# Patient Record
Sex: Female | Born: 1960 | Marital: Married | Smoking: Former smoker
Health system: Southern US, Community
[De-identification: ages and names within clinical notes are randomized; demographics above are authoritative.]

## PROBLEM LIST (undated history)

## (undated) DIAGNOSIS — F419 Anxiety disorder, unspecified: Secondary | ICD-10-CM

## (undated) HISTORY — DX: Anxiety disorder, unspecified: F41.9

## (undated) HISTORY — PX: TONSILLECTOMY: SUR1361

---

## 2014-12-20 DIAGNOSIS — K219 Gastro-esophageal reflux disease without esophagitis: Secondary | ICD-10-CM | POA: Insufficient documentation

## 2014-12-21 ENCOUNTER — Other Ambulatory Visit: Payer: Self-pay | Admitting: Family Medicine

## 2014-12-21 DIAGNOSIS — Z1231 Encounter for screening mammogram for malignant neoplasm of breast: Secondary | ICD-10-CM

## 2014-12-28 ENCOUNTER — Ambulatory Visit
Admission: RE | Admit: 2014-12-28 | Discharge: 2014-12-28 | Disposition: A | Discharge status: Home / Self Care | Payer: No Typology Code available for payment source | Source: Ambulatory Visit | Attending: Family Medicine | Admitting: Family Medicine

## 2014-12-28 DIAGNOSIS — Z1231 Encounter for screening mammogram for malignant neoplasm of breast: Secondary | ICD-10-CM | POA: Insufficient documentation

## 2015-01-17 ENCOUNTER — Other Ambulatory Visit: Payer: Self-pay | Admitting: Family Medicine

## 2015-01-17 DIAGNOSIS — R928 Other abnormal and inconclusive findings on diagnostic imaging of breast: Secondary | ICD-10-CM

## 2015-01-27 ENCOUNTER — Ambulatory Visit
Admission: RE | Admit: 2015-01-27 | Discharge: 2015-01-27 | Disposition: A | Discharge status: Home / Self Care | Payer: BLUE CROSS/BLUE SHIELD | Source: Ambulatory Visit | Attending: Family Medicine | Admitting: Family Medicine

## 2015-01-27 DIAGNOSIS — N63 Unspecified lump in breast: Secondary | ICD-10-CM | POA: Insufficient documentation

## 2015-01-27 DIAGNOSIS — R928 Other abnormal and inconclusive findings on diagnostic imaging of breast: Secondary | ICD-10-CM

## 2015-06-06 ENCOUNTER — Other Ambulatory Visit: Payer: Self-pay | Admitting: Family Medicine

## 2015-06-06 DIAGNOSIS — R928 Other abnormal and inconclusive findings on diagnostic imaging of breast: Secondary | ICD-10-CM

## 2015-07-14 ENCOUNTER — Ambulatory Visit
Admission: RE | Admit: 2015-07-14 | Discharge: 2015-07-14 | Disposition: A | Discharge status: Home / Self Care | Payer: 59 | Source: Ambulatory Visit | Attending: Family Medicine | Admitting: Family Medicine

## 2015-07-14 DIAGNOSIS — R928 Other abnormal and inconclusive findings on diagnostic imaging of breast: Secondary | ICD-10-CM

## 2015-08-22 ENCOUNTER — Emergency Department
Admission: EM | Admit: 2015-08-22 | Discharge: 2015-08-23 | Disposition: A | Discharge status: Home / Self Care | Payer: 59 | Attending: Student | Admitting: Student

## 2015-08-22 ENCOUNTER — Emergency Department: Payer: 59

## 2015-08-22 ENCOUNTER — Encounter: Payer: Self-pay | Admitting: Emergency Medicine

## 2015-08-22 DIAGNOSIS — M25551 Pain in right hip: Secondary | ICD-10-CM | POA: Diagnosis present

## 2015-08-22 DIAGNOSIS — S76021A Laceration of muscle, fascia and tendon of right hip, initial encounter: Secondary | ICD-10-CM | POA: Insufficient documentation

## 2015-08-22 DIAGNOSIS — Y929 Unspecified place or not applicable: Secondary | ICD-10-CM | POA: Diagnosis not present

## 2015-08-22 DIAGNOSIS — T148XXA Other injury of unspecified body region, initial encounter: Secondary | ICD-10-CM

## 2015-08-22 DIAGNOSIS — Y999 Unspecified external cause status: Secondary | ICD-10-CM | POA: Diagnosis not present

## 2015-08-22 DIAGNOSIS — Y939 Activity, unspecified: Secondary | ICD-10-CM | POA: Diagnosis not present

## 2015-08-22 DIAGNOSIS — W1839XA Other fall on same level, initial encounter: Secondary | ICD-10-CM | POA: Insufficient documentation

## 2015-08-22 DIAGNOSIS — Z87891 Personal history of nicotine dependence: Secondary | ICD-10-CM | POA: Insufficient documentation

## 2015-08-22 DIAGNOSIS — R58 Hemorrhage, not elsewhere classified: Secondary | ICD-10-CM | POA: Diagnosis not present

## 2015-08-22 DIAGNOSIS — W19XXXA Unspecified fall, initial encounter: Secondary | ICD-10-CM

## 2015-08-22 LAB — BASIC METABOLIC PANEL
Anion gap: 8 (ref 5–15)
BUN: 13 mg/dL (ref 6–20)
CO2: 27 mmol/L (ref 22–32)
CREATININE: 0.6 mg/dL (ref 0.44–1.00)
Calcium: 9.5 mg/dL (ref 8.9–10.3)
Chloride: 105 mmol/L (ref 101–111)
GFR calc Af Amer: >60 mL/min (ref >60)
GLUCOSE: 94 mg/dL (ref 65–99)
Potassium: 3.9 mmol/L (ref 3.5–5.1)
SODIUM: 140 mmol/L (ref 135–145)

## 2015-08-22 LAB — CBC WITH DIFFERENTIAL/PLATELET
Basophils Absolute: 0.1 10*3/uL (ref 0–0.1)
Basophils Relative: 1 %
EOS ABS: 0.2 10*3/uL (ref 0–0.7)
EOS PCT: 3 %
HCT: 36.3 % (ref 35.0–47.0)
Hemoglobin: 12.9 g/dL (ref 12.0–16.0)
LYMPHS ABS: 2.5 10*3/uL (ref 1.0–3.6)
Lymphocytes Relative: 30 %
MCH: 31.9 pg (ref 26.0–34.0)
MCHC: 35.7 g/dL (ref 32.0–36.0)
MCV: 89.5 fL (ref 80.0–100.0)
MONO ABS: 0.7 10*3/uL (ref 0.2–0.9)
MONOS PCT: 9 %
Neutro Abs: 4.9 10*3/uL (ref 1.4–6.5)
Neutrophils Relative %: 57 %
PLATELETS: 250 10*3/uL (ref 150–440)
RBC: 4.06 MIL/uL (ref 3.80–5.20)
RDW: 13.1 % (ref 11.5–14.5)
WBC: 8.4 10*3/uL (ref 3.6–11.0)

## 2015-08-22 LAB — POCT PREGNANCY, URINE: Preg Test, Ur: NEGATIVE

## 2015-08-22 MED ORDER — IOPAMIDOL (ISOVUE-300) INJECTION 61%
100.0000 mL | Freq: Once | INTRAVENOUS | Status: AC | PRN
  Administered 2015-08-22: 100 mL via INTRAVENOUS
  Filled 2015-08-22: qty 100

## 2015-08-22 MED ORDER — DIATRIZOATE MEGLUMINE & SODIUM 66-10 % PO SOLN
15.0000 mL | Freq: Once | ORAL | Status: AC
  Administered 2015-08-22: 15 mL via ORAL

## 2015-08-22 NOTE — ED Notes (Signed)
NP at bedside.

## 2015-08-22 NOTE — ED Triage Notes (Signed)
Patient ambulatory to triage with steady gait, without difficulty or distress noted; pt reports falling off porch last Wed and hitting right hip/groin on concrete barrier; c/o persistent pain with bruising; ibuprofen 1tab taken at 1230pm with relief; st pain increases with weight bearing

## 2015-08-22 NOTE — ED Provider Notes (Signed)
[facility] Emergency Department Provider Note ____________________________________________  Time seen: Approximately 8:20 PM  I have reviewed the triage vital signs and the nursing notes.   HISTORY  Chief Complaint Hip Pain    HPI [name] is a 55 y.o. female presents to the emergency department for evaluation of right hip and abdominal pain. She states that  approximately 1 week ago, she fellfrom her porch and hit her right hip and groin on a concrete landscape barrier. She has had persistent pain in the hip and right lower abdomen since that time. She has been taking ibuprofen "like candy." She states that the ibuprofen does provide some relief. She states that her hip hurts with certain motions, such as getting into the car and going from a seated to a standing position. She states that her abdomen has been very bloated since this occurred and has a sensation "like I drank a bunch of water." She denies dizziness, nausea, vomiting or fever. She denies hematuria or dark tarry stools.  History reviewed. No pertinent past medical history.  There are no active problems to display for this patient.   Past Surgical History:  Procedure Laterality Date  . TONSILLECTOMY      Prior to Admission medications   Not on File    Allergies Review of patient's allergies indicates no known allergies.  Family History  Problem Relation Age of Onset  . Breast cancer Neg Hx     Social History Social History  Substance Use Topics  . Smoking status: Former Games developer  . Smokeless tobacco: Never Used  . Alcohol use No    Review of Systems Constitutional: No recent illness. Cardiovascular: Denies chest pain or palpitations. Respiratory: Denies shortness of breath. Musculoskeletal: Pain in Right hip Abdominal:  Positive for right-sided abdominal pain Skin: Negative for rash, wound, lesion. Neurological: Negative for focal weakness or  numbness.  ____________________________________________   PHYSICAL EXAM:  VITAL SIGNS: ED Triage Vitals [08/22/15 1929]  Enc Vitals Group     BP (!) 148/82     Pulse Rate 71     Resp 18     Temp 97.6 F (36.4 C)     Temp Source Oral     SpO2 100 %     Weight 140 lb (63.5 kg)     Height 5\' 4"  (1.626 m)     Head Circumference      Peak Flow      Pain Score 4     Pain Loc      Pain Edu?      Excl. in GC?     Constitutional: Alert and oriented. Well appearing and in no acute distress. Eyes: Conjunctivae are normal. EOMI. Head: Atraumatic. Neck: No stridor.  Respiratory: Normal respiratory effort.   Musculoskeletal: No increase in pain of the right hip with internal or external rotation. Tenderness elicited with palpation over the anterior, superior iliac spine. No tenderness to palpation over the entire length of the right lower extremity. Neurologic:  Normal speech and language. No gross focal neurologic deficits are appreciated. Speech is normal. No gait instability. Skin:  Skin is warm and dry. Scabbed abrasion noted to the right hip with widespread ecchymosis over the right lower quadrant of the abdomen that extends distally over the hip and right lower extremity above the knee. Psychiatric: Mood and affect are normal. Speech and behavior are normal.  ____________________________________________   LABS (all labs ordered are listed, but only abnormal results are displayed)  Labs Reviewed  CBC WITH DIFFERENTIAL/PLATELET  BASIC METABOLIC PANEL  POCT PREGNANCY, URINE   ____________________________________________  RADIOLOGY  Right hip film negative for acute bony abnormality.  CT abdomen and pelvis findings per radiology. Focal tear of the right transverse abdominis and external oblique musculature extending from level superior to the iliac crest inferiorly. No significant hematoma or fluid collection. No other acute/ traumatic intra-abdominal or pelvic pathology  identified. There are innumerable hepatic hypodense lesions measuring up to 5.3 cm in the right lobe of the liver. These lesions are incompletely characterized but most likely represent cysts or hemangiomas. MRI may provide better characterisation. ____________________________________________   PROCEDURES  Procedure(s) performed: None   ____________________________________________   INITIAL IMPRESSION / ASSESSMENT AND PLAN / ED COURSE  Pertinent labs & imaging results that were available during my care of the patient were reviewed by me and considered in my medical decision making (see chart for details).  Lifting restrictions were discussed with the patient. She was advised to follow-up with her primary care provider for recheck of the abdominal wall as well as further evaluation of the cystic structures identified on the CT scan. She was advised to avoid Tylenol and Tylenol containing medications until cleared by the primary care provider. She was instructed to take Aleve twice per day as needed. She was instructed to return to the emergency department for symptoms that change or worsen if she is unable to schedule an appointment. ____________________________________________   FINAL CLINICAL IMPRESSION(S) / ED DIAGNOSES  Final diagnoses:  Muscle tear  Ecchymosis  Hip pain, right       Chinita Pester, FNP 08/23/15 0017    Gayla Doss, MD 08/23/15 [id number]

## 2015-08-22 NOTE — ED Notes (Signed)
See triage note..the patient does having bruising to R hip consistent w/ injury.  Pt sts that spouse noticed increased bruising going down R thigh.  Pt also sts that she "feels full" all the time and has "bloated belly".  Pt requested delaying XR until after speaking w/ caregiver.

## 2015-08-23 NOTE — ED Notes (Signed)
Discharge instructions reviewed with patient. Questions fielded by this RN. Patient verbalizes understanding of instructions. Patient discharged home in stable condition per Triplett NP. No acute distress noted at time of discharge.   

## 2015-08-23 NOTE — Discharge Instructions (Signed)
Schedule an appointment with your primary care provider for further evaluation of the cystic structures on your liver. Also, follow up for recheck of the abdominal wall muscle tear.  Return to the ER for symptoms that change or worsen or for new concerns.

## 2015-08-23 NOTE — ED Notes (Signed)
NP at bedside.

## 2015-09-14 ENCOUNTER — Other Ambulatory Visit: Payer: Self-pay | Admitting: Gastroenterology

## 2015-09-14 DIAGNOSIS — K769 Liver disease, unspecified: Secondary | ICD-10-CM

## 2015-09-27 ENCOUNTER — Ambulatory Visit
Admission: RE | Admit: 2015-09-27 | Discharge: 2015-09-27 | Disposition: A | Discharge status: Home / Self Care | Payer: 59 | Source: Ambulatory Visit | Attending: Gastroenterology | Admitting: Gastroenterology

## 2015-09-27 DIAGNOSIS — K769 Liver disease, unspecified: Secondary | ICD-10-CM

## 2015-09-27 MED ORDER — GADOBENATE DIMEGLUMINE 529 MG/ML IV SOLN
13.0000 mL | Freq: Once | INTRAVENOUS | Status: AC | PRN
  Administered 2015-09-27: 13 mL via INTRAVENOUS

## 2015-12-19 ENCOUNTER — Ambulatory Visit (HOSPITAL_COMMUNITY): Payer: 59 | Admitting: Licensed Clinical Social Worker

## 2015-12-20 DIAGNOSIS — Z0000 Encounter for general adult medical examination without abnormal findings: Secondary | ICD-10-CM | POA: Insufficient documentation

## 2015-12-20 DIAGNOSIS — R946 Abnormal results of thyroid function studies: Secondary | ICD-10-CM | POA: Insufficient documentation

## 2016-03-25 DIAGNOSIS — N200 Calculus of kidney: Secondary | ICD-10-CM | POA: Insufficient documentation

## 2016-03-25 DIAGNOSIS — K7689 Other specified diseases of liver: Secondary | ICD-10-CM | POA: Insufficient documentation

## 2016-12-20 DIAGNOSIS — H833X3 Noise effects on inner ear, bilateral: Secondary | ICD-10-CM | POA: Insufficient documentation

## 2018-01-31 IMAGING — MR MR ABDOMEN WO/W CM
18 series · 48 of 48 positions shown · IV contrast (multihance)
Comparison: CT of 08/22/2015

CLINICAL DATA: CT demonstrating liver lesions. No primary
malignancy history submitted.

EXAM:
MRI ABDOMEN WITHOUT AND WITH CONTRAST
TECHNIQUE: Multiplanar multisequence MR imaging of the abdomen was performed
both before and after the administration of intravenous contrast.
CONTRAST:  13mL MULTIHANCE GADOBENATE DIMEGLUMINE 529 MG/ML IV SOLN

[Series 3: T2 · coronal · 5.0mm · 1.56mm/px · 2 of 36 slices shown (1 of 3)]
[im 1/36]
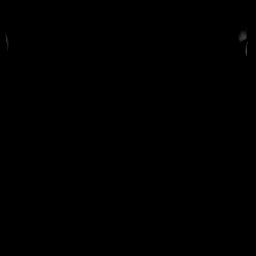
[im 36/36]
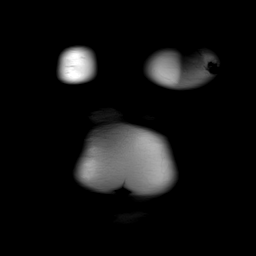

[Series 4: T1 · axial · 3.0mm · 1.19mm/px · z∈[-164,+73]mm · 6 of 160 slices shown]
[im 1/160]
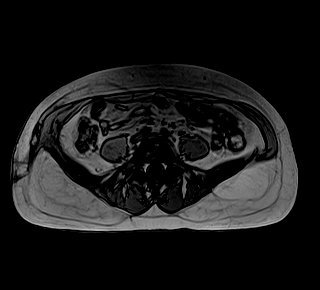
[im 32/160]
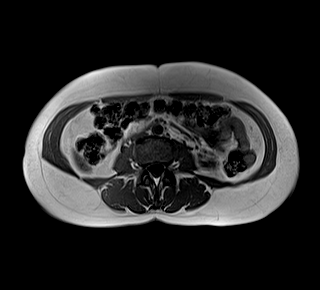
[im 64/160]
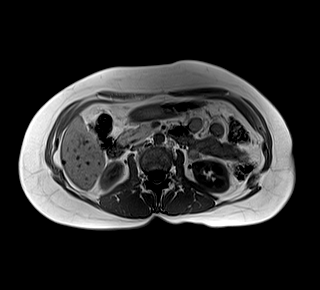
[im 96/160]
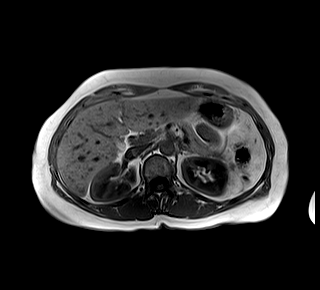
[im 128/160]
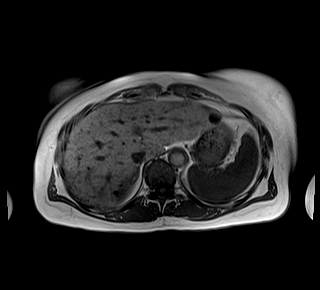
[im 160/160]
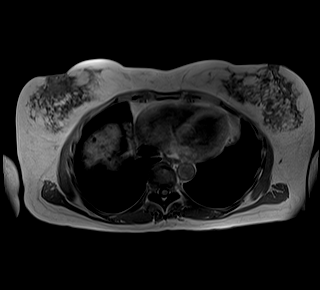

[Series 5: T2 · axial · 5.0mm · 1.48mm/px · 1 of 38 slices shown (2 of 3)]
[im 1/38]
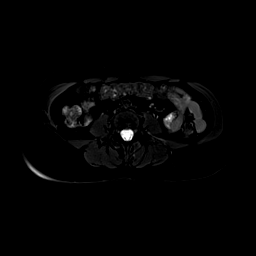

[Series 7: DWI · axial · 5.0mm · 1.42mm/px · z∈[-148,+110]mm · 4 of 132 slices shown (1 of 2)]
[im 1/132]
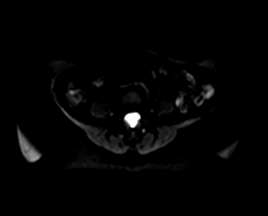
[im 44/132]
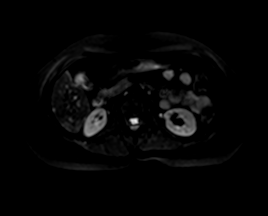
[im 88/132]
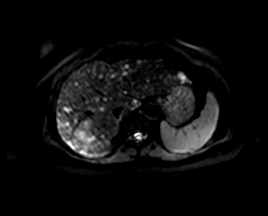
[im 132/132]
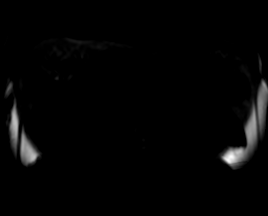

[Series 8: DWI · axial · 5.0mm · 1.42mm/px · 1 of 44 slices shown (2 of 2)]
[im 1/44]
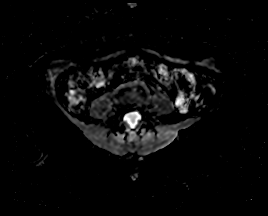

[Series 9: T2 · axial · 6.0mm · 1.22mm/px · 1 of 38 slices shown (3 of 3)]
[im 1/38]
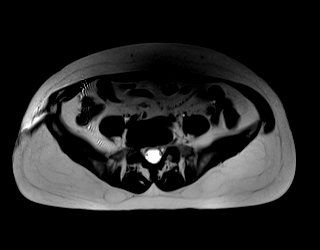

[Series 10: bSSFP · axial · 5.0mm · 1.25mm/px · 1 of 38 slices shown]
[im 1/38]
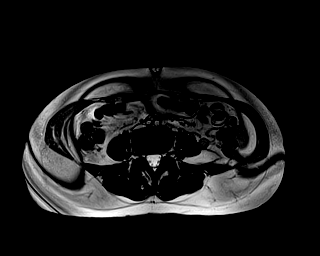

[Series 11: T1 dynamic · axial · non-contrast · 3.0mm · 1.25mm/px · z∈[-136,+101]mm · 3 of 80 slices shown (1 of 2)]
[im 1/80]
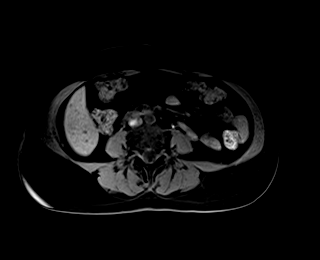
[im 40/80]
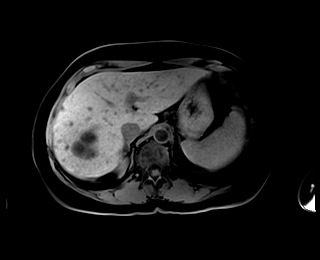
[im 80/80]
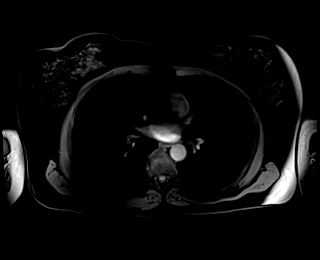

[Series 12: T1 dynamic · axial · non-contrast · 3.0mm · 1.25mm/px · z∈[-174,+87]mm · 3 of 88 slices shown (2 of 2)]
[im 1/88]
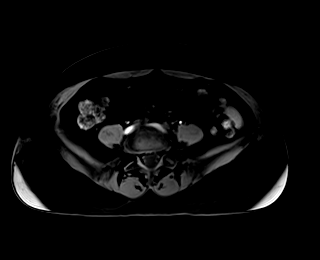
[im 44/88]
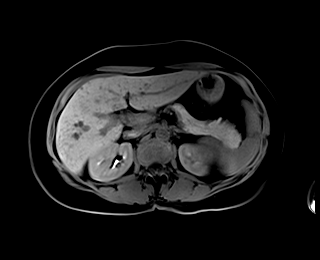
[im 88/88]
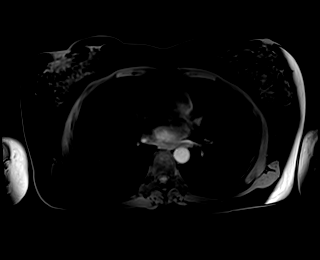

[Series 13: T1 dynamic post-contrast · axial · 3.0mm · 1.25mm/px · z∈[-174,+87]mm · 3 of 88 slices shown (1 of 9)]
[im 1/88]
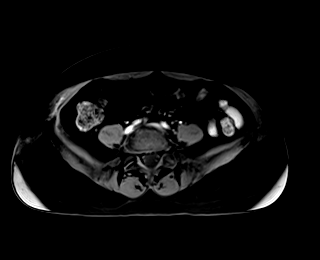
[im 44/88]
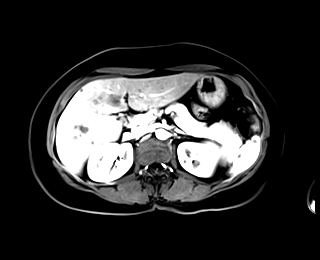
[im 88/88]
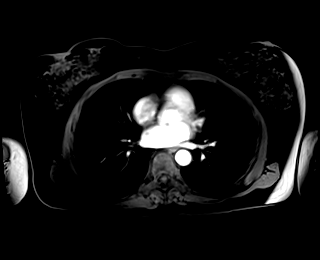

[Series 14: T1 dynamic post-contrast · axial · 3.0mm · 1.25mm/px · z∈[-174,+87]mm · 3 of 88 slices shown (2 of 9)]
[im 1/88]
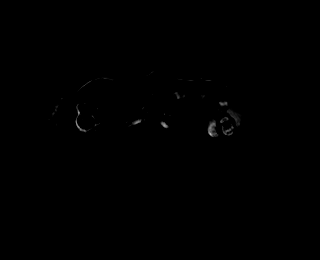
[im 44/88]
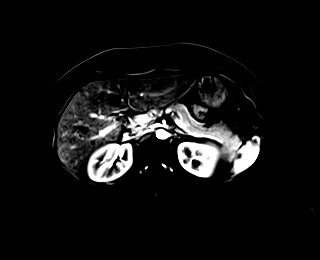
[im 88/88]
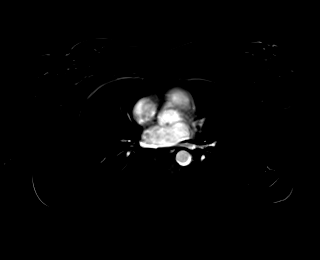

[Series 15: T1 dynamic post-contrast · axial · 3.0mm · 1.25mm/px · z∈[-174,+87]mm · 3 of 88 slices shown (3 of 9)]
[im 1/88]
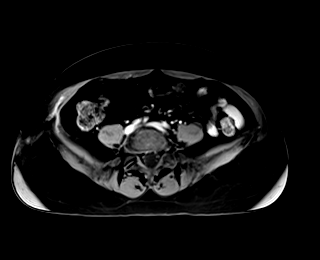
[im 44/88]
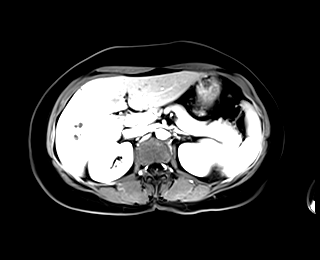
[im 88/88]
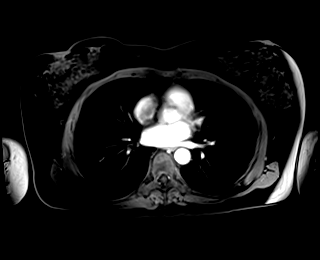

[Series 16: T1 dynamic post-contrast · axial · 3.0mm · 1.25mm/px · z∈[-174,+87]mm · 3 of 88 slices shown (4 of 9)]
[im 1/88]
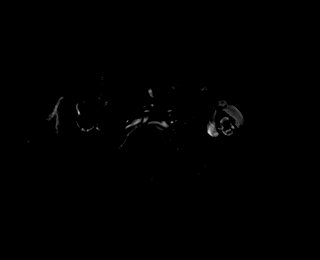
[im 44/88]
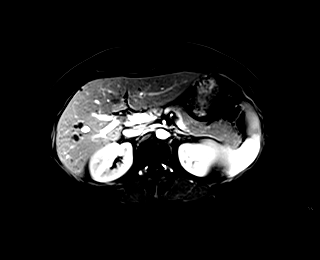
[im 88/88]
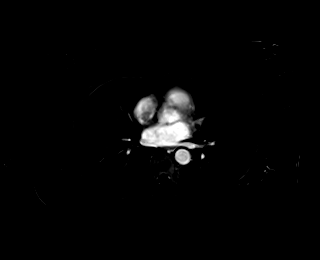

[Series 17: T1 dynamic post-contrast · axial · 3.0mm · 1.25mm/px · z∈[-174,+87]mm · 3 of 88 slices shown (5 of 9)]
[im 1/88]
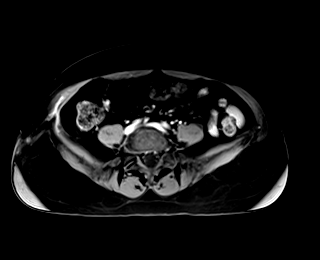
[im 44/88]
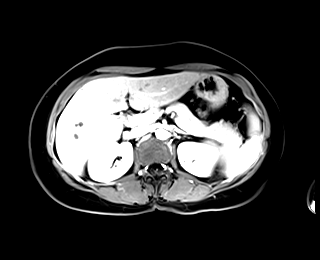
[im 88/88]
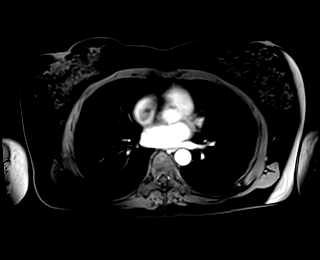

[Series 18: T1 dynamic post-contrast · axial · 3.0mm · 1.25mm/px · z∈[-174,+87]mm · 3 of 88 slices shown (6 of 9)]
[im 1/88]
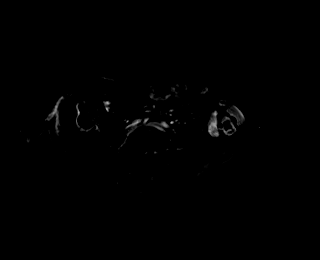
[im 44/88]
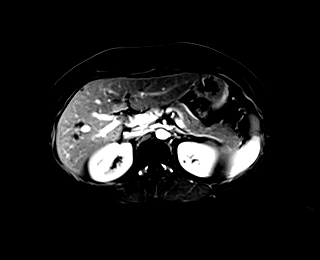
[im 88/88]
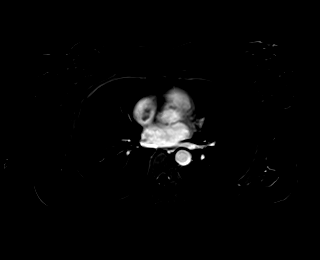

[Series 19: T1 dynamic post-contrast · coronal · 3.0mm · 1.34mm/px · 2 of 72 slices shown (7 of 9)]
[im 1/72]
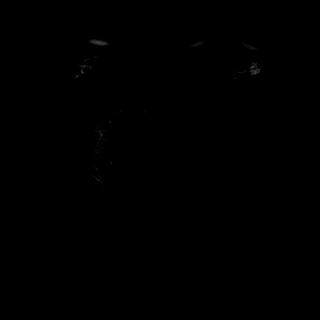
[im 72/72]
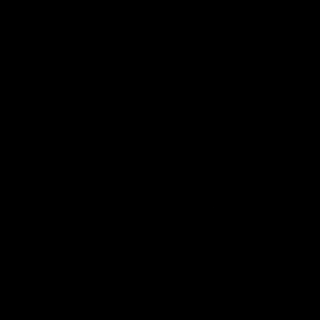

[Series 20: T1 dynamic post-contrast · axial · 3.0mm · 1.25mm/px · z∈[-174,+87]mm · 3 of 88 slices shown (8 of 9)]
[im 1/88]
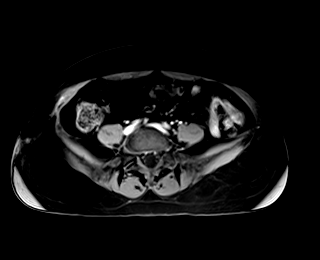
[im 44/88]
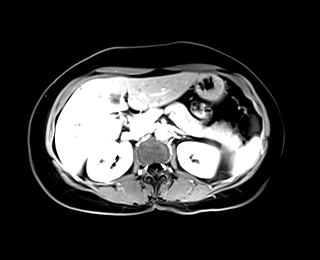
[im 88/88]
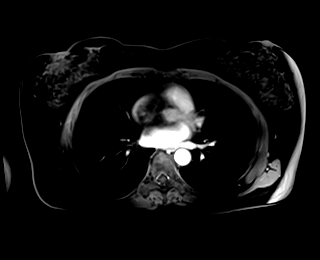

[Series 21: T1 dynamic post-contrast · axial · 3.0mm · 1.25mm/px · z∈[-174,+87]mm · 3 of 88 slices shown (9 of 9)]
[im 1/88]
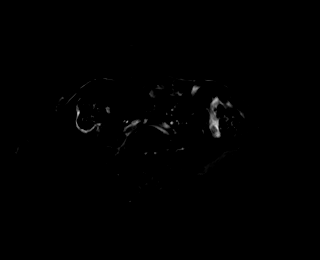
[im 44/88]
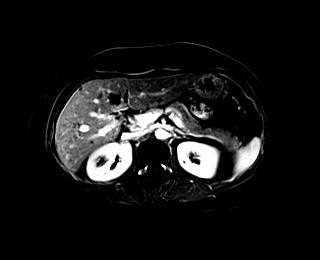
[im 88/88]
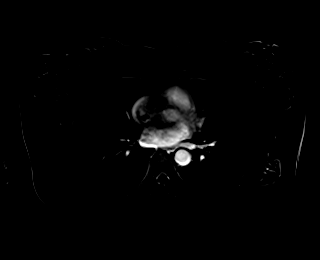

[48 of 48 positions shown; findings below may reference images not displayed]

FINDINGS: Lower chest: Normal heart size without pericardial or pleural
effusion.

Hepatobiliary: Hepatomegaly is mild at 19.1 cm craniocaudal.
Innumerable T2 hyperintense lesions throughout the liver. Dominant
lesion in the right posterior hepatic lobe measures 5.0 x 3.8 cm.
This lesion, as well as other larger lesions including in the
lateral segment left liver lobe 1.4 cm, are most consistent with
cysts. Smaller lesions (on the order of 1.5 cm or less) are most
consistent with bile duct hamartomas. No suspicious lesion
identified. Normal gallbladder, without biliary ductal dilatation.

Pancreas: Dorsal pancreatic duct seen entering the duodenum on image
28/series 5. Communication between the dorsal and ventral ducts
however identified. No pancreatic duct dilatation or acute
pancreatitis.

Spleen: Normal in size, without focal abnormality.

Adrenals/Urinary Tract: Normal adrenal glands. Tiny bilateral renal
lesions which are likely cysts. No hydronephrosis.

Stomach/Bowel: Normal stomach and abdominal bowel loops.

Vascular/Lymphatic: Normal caliber of the aorta and branch vessels.
No retroperitoneal or retrocrural adenopathy.

Other: No ascites.

Musculoskeletal: No acute osseous abnormality.
IMPRESSION: 1. The liver lesions are consistent with a combination of cysts and
bile duct hamartomas. No suspicious liver lesion identified.
2. Hepatomegaly.
3. Pancreas divisum variant, with prominent dorsal duct entering the
duodenum but communication between the dorsal and ventral duct
identified.

## 2018-04-09 DIAGNOSIS — M542 Cervicalgia: Secondary | ICD-10-CM | POA: Insufficient documentation

## 2018-07-16 ENCOUNTER — Ambulatory Visit: Payer: 59 | Admitting: Licensed Clinical Social Worker

## 2018-07-16 ENCOUNTER — Encounter

## 2018-07-16 ENCOUNTER — Other Ambulatory Visit: Payer: Self-pay

## 2018-07-16 DIAGNOSIS — F411 Generalized anxiety disorder: Secondary | ICD-10-CM

## 2018-07-16 NOTE — Progress Notes (Signed)
[place] Initial Clinical Assessment  MRN: [id number] NAME: Catarina Huntley Date: 07/16/18  Start time:   End time:   Total time:  45 min Call number:  [phone]  Type of Contact:  telephone Patient consent obtained:  yes Reason for Visit today:  establish care  Treatment History Patient recently received Inpatient Treatment:  denies  Facility/Program:    Date of discharge:   Patient currently being seen by therapist/psychiatrist:  denies Patient currently receiving the following services:    Past Psychiatric History/Hospitalization(s): Anxiety: Yes Bipolar Disorder: No Depression: No Mania: No Psychosis: No Schizophrenia: No Personality Disorder: No Hospitalization for psychiatric illness: No History of Electroconvulsive Shock Therapy: No Prior Suicide Attempts: No  Clinical Assessment:  PHQ-9 Assessments: No flowsheet data found.  GAD-7 Assessments: No flowsheet data found.   Social Functioning Social maturity:  below stated age Social judgement:  below stated age  Stress Current stressors:  racing mind, relationship with husband Familial stressors:  "husband left me" Sleep:  falls asleep quickly on most nights; since husband left the home takes longer to fall asleep Appetite:  has a good appetite; eats healthy, takes a multivitamin daily Coping ability:   Patient taking medications as prescribed:    Current medications:  No outpatient encounter medications on file as of 07/16/2018.   No facility-administered encounter medications on file as of 07/16/2018.     Self-harm Behaviors Risk Assessment Self-harm risk factors:   Patient endorses recent thoughts of harming self:    [place] Suicide Severity Rating Scale: No flowsheet data found.  Danger to Others Risk Assessment Danger to others risk factors:   Patient endorses recent thoughts of harming others:    Dynamic Appraisal of Situational Aggression (DASA): No flowsheet data  found.  Substance Use Assessment Patient recently consumed alcohol:    Alcohol Use Disorder Identification Test (AUDIT): No flowsheet data found. Patient recently used drugs:    Opioid Risk Assessment:  Patient is concerned about dependence or abuse of substances:    ASAM Multidimensional Assessment Summary:  Dimension 1:    Dimension 1 Rating:    Dimension 2:    Dimension 2 Rating:    Dimension 3:    Dimension 3 Rating:    Dimension 4:    Dimension 4 Rating:    Dimension 5:    Dimension 5 Rating:    Dimension 6:    Dimension 6 Rating:   ASAMs Severity Rating Score:   ASAM Recommended Level of Treatment:     Goals, Interventions and Follow-up Plan Goals: Increase healthy adjustment to current life circumstances Interventions: Motivational Interviewing, Solution-Focused Strategies and Brief CBT Follow-up Plan: Refer to [facility]  Summary of Clinical Assessment Summary:Ericka is a 58 year old woman that lives in [place].  Reports feeling anxious and frustrated daily.  Reports that she has had Psychiatric services for the past 6 years. Reports that her mind races daily.  Attempts to use coping skills such as: exercising and relaxation to reduce anxiety. First marriage was about 14 years. Has been married for the past 3 years.  Husband has left the home about 8 days ago. Has difficulty with her current husband's children.  They are "slow to grow up."  On edge with people in the community.  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     Lubertha [place], LCSW

## 2018-07-21 ENCOUNTER — Ambulatory Visit: Payer: 59 | Admitting: Licensed Clinical Social Worker

## 2018-07-21 ENCOUNTER — Other Ambulatory Visit: Payer: Self-pay

## 2018-08-27 ENCOUNTER — Encounter: Payer: Self-pay | Admitting: Psychiatry

## 2018-08-27 ENCOUNTER — Other Ambulatory Visit: Payer: Self-pay

## 2018-08-27 ENCOUNTER — Ambulatory Visit (INDEPENDENT_AMBULATORY_CARE_PROVIDER_SITE_OTHER): Payer: 59 | Admitting: Psychiatry

## 2018-08-27 DIAGNOSIS — F411 Generalized anxiety disorder: Secondary | ICD-10-CM

## 2018-08-27 MED ORDER — HYDROXYZINE HCL 25 MG PO TABS: 12.5000 mg | ORAL_TABLET | Freq: Every day | ORAL | 1 refills | Status: DC | PRN

## 2018-08-27 MED ORDER — CITALOPRAM HYDROBROMIDE 10 MG PO TABS: 10.0000 mg | ORAL_TABLET | Freq: Every day | ORAL | 1 refills | Status: DC

## 2018-08-27 NOTE — Progress Notes (Signed)
Virtual Visit via Video Note  I connected with [name] on 08/27/18 at  9:00 AM EDT by a video enabled telemedicine application and verified that I am speaking with the correct person using two identifiers.   I discussed the limitations of evaluation and management by telemedicine and the availability of in person appointments. The patient expressed understanding and agreed to proceed.   I discussed the assessment and treatment plan with the patient. The patient was provided an opportunity to ask questions and all were answered. The patient agreed with the plan and demonstrated an understanding of the instructions.   The patient was advised to call back or seek an in-person evaluation if the symptoms worsen or if the condition fails to improve as anticipated.    Psychiatric Initial Adult Assessment   Patient Identification: [name] MRN:  [id number] Date of Evaluation:  08/27/2018 Referral Source: Ms.[facility] Chief Complaint:   Chief Complaint    Establish Care; Other     Visit Diagnosis:    ICD-10-CM   1. GAD (generalized anxiety disorder)  F41.1 citalopram (CELEXA) 10 MG tablet    hydrOXYzine (ATARAX/VISTARIL) 25 MG tablet    History of Present Illness:  [name] is a 58 year old Caucasian female who is married, employed, lives in [place], has a history of anxiety, was evaluated by telemedicine today.  Patient reports that she has been struggling with anxiety all her life.  Her anxiety started worsening or she became more aware of it 6 years ago.  She describes her anxiety as feeling restless, fidgety, racing thoughts, inability to sit still, inability to focus, worrying.  Patient also reports tremors of her hands when she is very anxious.  While patient was in session she will hold the phone well and was noticed that shaking.  She reports this has been getting worse since the past several years.  She reports she has been to several psychologist for counseling however she did  not stay with anyone of them since she felt like they could not help her.  She reports she also struggles with focus and concentration.  She feels hyperactive often.  She also has trouble staying on task due to attention problems.  She reports she was often called as a daydreamer while in school however she got through school okay.  She reports she was never diagnosed with ADHD however her husband feels she may have it.  Patient reports sleep is okay.  Patient denies any history of trauma.  Patient denies any manic or hypomanic episodes.  She denies any depressive symptoms.  Patient denies any suicidality, homicidality or perceptual disturbances.  Patient reports she and her husband are currently living in separate homes because of her mental status.  She reports her husband works from home and and due to her emotional problem and the anxiety her husband decided to get a new place and stay there for a while.  She reports they are planning to start marriage counseling soon and eventually get back together. Patient reports her husband continues to be a good support system for her.  Patient reports she is also a very spiritual person and that also helps her to some extent in coping with her anxiety.    Associated Signs/Symptoms: Depression Symptoms:  denies  (Hypo) Manic Symptoms:  Irritable Mood, Anxiety Symptoms:  Excessive Worry, Psychotic Symptoms:  denies PTSD Symptoms: Negative  Past Psychiatric History: Patient denies inpatient mental health admissions.  Patient denies any suicide attempts.  Previous Psychotropic Medications: No Denies  Substance Abuse History in the last 12 months:  No.  Consequences of Substance Abuse: Negative  Past Medical History:  Past Medical History:  Diagnosis Date  . Anxiety     Past Surgical History:  Procedure Laterality Date  . TONSILLECTOMY      Family Psychiatric History: Patient denies history of mental health problems in her  family.  Family History:  Family History  Problem Relation Age of Onset  . Breast cancer Neg Hx   . Mental illness Neg Hx     Social History:   Social History   Socioeconomic History  . Marital status: Married    Spouse name: Not on file  . Number of children: 0  . Years of education: Not on file  . Highest education level: High school graduate  Occupational History  . Occupation: hairdresser  Social Needs  . Financial resource strain: Not hard at all  . Food insecurity    Worry: Never true    Inability: Never true  . Transportation needs    Medical: No    Non-medical: No  Tobacco Use  . Smoking status: Former Games developermoker  . Smokeless tobacco: Never Used  Substance and Sexual Activity  . Alcohol use: Not Currently  . Drug use: Never  . Sexual activity: Yes    Partners: Male  Lifestyle  . Physical activity    Days per week: 7 days    Minutes per session: 60 min  . Stress: Very much  Relationships  . Social Musicianconnections    Talks on phone: Never    Gets together: Never    Attends religious service: 1 to 4 times per year    Active member of club or organization: No    Attends meetings of clubs or organizations: Never    Relationship status: Married  Other Topics Concern  . Not on file  Social History Narrative  . Not on file    Additional Social History: Patient is married.  She denies having children.  She was divorced once from a previous marriage.  Her current husband and her are living at separate homes now.  However they plan to get marriage counseling and eventually get back together.  Patient works as a Interior and spatial designerhairdresser, she reports she works only once a week because her husband supports her.  Allergies:   Allergies  Allergen Reactions  . Codeine Nausea Only    Metabolic Disorder Labs: No results found for: HGBA1C, MPG No results found for: PROLACTIN No results found for: CHOL, TRIG, HDL, CHOLHDL, VLDL, LDLCALC No results found for: TSH  Therapeutic Level  Labs: No results found for: LITHIUM No results found for: CBMZ No results found for: VALPROATE  Current Medications: Current Outpatient Medications  Medication Sig Dispense Refill  . valACYclovir (VALTREX) 500 MG tablet     . citalopram (CELEXA) 10 MG tablet Take 1 tablet (10 mg total) by mouth daily with breakfast. Start taking 5 mg for 2 weeks and increase to 10 mg 30 tablet 1  . hydrOXYzine (ATARAX/VISTARIL) 25 MG tablet Take 0.5-1 tablets (12.5-25 mg total) by mouth daily as needed for anxiety. For severe anxiety symptoms 30 tablet 1   No current facility-administered medications for this visit.     Musculoskeletal: Strength & Muscle Tone: UTA   Gait & Station: normal Patient leans: N/A  Psychiatric Specialty Exam: Review of Systems  Neurological: Positive for tremors.  Psychiatric/Behavioral: The patient is nervous/anxious.   All other systems reviewed and are negative.   There  were no vitals taken for this visit.There is no height or weight on file to calculate BMI.  General Appearance: Casual  Eye Contact:  Fair  Speech:  Clear and Coherent  Volume:  Normal  Mood:  Anxious  Affect:  Appropriate  Thought Process:  Goal Directed and Descriptions of Associations: Intact  Orientation:  Full (Time, Place, and Person)  Thought Content:  Logical  Suicidal Thoughts:  No  Homicidal Thoughts:  No  Memory:  Immediate;   Fair Recent;   Fair Remote;   Fair  Judgement:  Fair  Insight:  Fair  Psychomotor Activity:  Increased, Restlessness and Tremor  Concentration:  Concentration: Fair and Attention Span: Fair  Recall:  [employer] of Knowledge:Fair  Language: Fair  Akathisia:  No  Handed:  Right  AIMS (if indicated):  UTA  Assets:  Communication Skills Desire for Improvement Social Support  ADL's:  Intact  Cognition: WNL  Sleep:  Fair   Screenings: GAD-7     Office Visit from 08/27/2018 in [place]  Total GAD-7 Score  14       Assessment and Plan: Aashi is a 58 year old Caucasian female, is married, employed, lives in [place], has a history of anxiety and attention problems was evaluated by telemedicine today.  She is currently going through the psychosocial stressors of being separated from her husband, current pandemic.  Patient currently denies any family history of mental health problems.  She also denies any trauma.  She denies any substance abuse problems however does report a remote history of binging on alcohol in the past.  Patient will benefit from medication readjustment as well as psychotherapy sessions.  Plan For GAD-unstable GAD 7 equals 14 Start Celexa 5 mg p.o. daily for a week and increase to 10 mg. Continue psychotherapy sessions-we will refer her to Ms. Alden Hipp. Add hydroxyzine 12.5 to 25 mg p.o. daily PRN for severe anxiety attacks Provided medication education   Rule out ADHD-patient completed ADHD adult screening questionnaire- scored 2 on part A, 8 on part B.  Discussed with patient that we will continue to monitor her symptoms and if required will send her for a formal ADHD testing.  Patient reports she had thyroid tests done recently-reviewed-TSH- 12/4/ 2019-within normal limits.  Follow-up in clinic in 2 weeks or sooner if needed.  September 1 at 9:45 AM  I have spent atleast 40 minutes non face to face with patient today. More than 50 % of the time was spent for psychoeducation and supportive psychotherapy and care coordination.  This note was generated in part or whole with voice recognition software. Voice recognition is usually quite accurate but there are transcription errors that can and very often do occur. I apologize for any typographical errors that were not detected and corrected.      Ursula Alert, MD 8/6/20202:42 PM

## 2018-08-27 NOTE — Progress Notes (Signed)
Spoke with patient. Reviewed and updated medical history, surgical history & allergies. Medication & pharmacy reviewed and updated. No vitals taken due to phone visit.

## 2018-08-27 NOTE — Patient Instructions (Signed)
Hydroxyzine capsules or tablets What is this medicine? HYDROXYZINE (hye [place] i zeen) is an antihistamine. This medicine is used to treat allergy symptoms. It is also used to treat anxiety and tension. This medicine can be used with other medicines to induce sleep before surgery. This medicine may be used for other purposes; ask your health care provider or pharmacist if you have questions. COMMON BRAND NAME(S): ANX, Atarax, Rezine, Vistaril What should I tell my health care provider before I take this medicine? They need to know if you have any of these conditions:  glaucoma  heart disease  history of irregular heartbeat  kidney disease  liver disease  lung or breathing disease, like asthma  stomach or intestine problems  thyroid disease  trouble passing urine  an unusual or allergic reaction to hydroxyzine, cetirizine, other medicines, foods, dyes or preservatives  pregnant or trying to get pregnant  breast-feeding How should I use this medicine? Take this medicine by mouth with a full glass of water. Follow the directions on the prescription label. You may take this medicine with food or on an empty stomach. Take your medicine at regular intervals. Do not take your medicine more often than directed. Talk to your pediatrician regarding the use of this medicine in children. Special care may be needed. While this drug may be prescribed for children as young as 59 years of age for selected conditions, precautions do apply. Patients over 59 years old may have a stronger reaction and need a smaller dose. Overdosage: If you think you have taken too much of this medicine contact a poison control center or emergency room at once. NOTE: This medicine is only for you. Do not share this medicine with others. What if I miss a dose? If you miss a dose, take it as soon as you can. If it is almost time for your next dose, take only that dose. Do not take double or extra doses. What may  interact with this medicine? Do not take this medicine with any of the following medications:  cisapride  dronedarone  pimozide  thioridazine This medicine may also interact with the following medications:  alcohol  antihistamines for allergy, cough, and cold  atropine  barbiturate medicines for sleep or seizures, like phenobarbital  certain antibiotics like erythromycin or clarithromycin  certain medicines for anxiety or sleep  certain medicines for bladder problems like oxybutynin, tolterodine  certain medicines for depression or psychotic disturbances  certain medicines for irregular heart beat  certain medicines for Parkinson's disease like benztropine, trihexyphenidyl  certain medicines for seizures like phenobarbital, primidone  certain medicines for stomach problems like dicyclomine, hyoscyamine  certain medicines for travel sickness like scopolamine  ipratropium  narcotic medicines for pain  other medicines that prolong the QT interval (an abnormal heart rhythm) like dofetilide This list may not describe all possible interactions. Give your health care provider a list of all the medicines, herbs, non-prescription drugs, or dietary supplements you use. Also tell them if you smoke, drink alcohol, or use illegal drugs. Some items may interact with your medicine. What should I watch for while using this medicine? Tell your doctor or health care professional if your symptoms do not improve. You may get drowsy or dizzy. Do not drive, use machinery, or do anything that needs mental alertness until you know how this medicine affects you. Do not stand or sit up quickly, especially if you are an older patient. This reduces the risk of dizzy or fainting spells. Alcohol may  interfere with the effect of this medicine. Avoid alcoholic drinks. Your mouth may get dry. Chewing sugarless gum or sucking hard candy, and drinking plenty of water may help. Contact your doctor if the  problem does not go away or is severe. This medicine may cause dry eyes and blurred vision. If you wear contact lenses you may feel some discomfort. Lubricating drops may help. See your eye doctor if the problem does not go away or is severe. If you are receiving skin tests for allergies, tell your doctor you are using this medicine. What side effects may I notice from receiving this medicine? Side effects that you should report to your doctor or health care professional as soon as possible:  allergic reactions like skin rash, itching or hives, swelling of the face, lips, or tongue  changes in vision  confusion  fast, irregular heartbeat  seizures  tremor  trouble passing urine or change in the amount of urine Side effects that usually do not require medical attention (report to your doctor or health care professional if they continue or are bothersome):  constipation  drowsiness  dry mouth  headache  tiredness This list may not describe all possible side effects. Call your doctor for medical advice about side effects. You may report side effects to FDA at 1-800-FDA-1088. Where should I keep my medicine? Keep out of the reach of children. Store at room temperature between 15 and 30 degrees C (59 and 86 degrees F). Keep container tightly closed. Throw away any unused medicine after the expiration date. NOTE: This sheet is a summary. It may not cover all possible information. If you have questions about this medicine, talk to your doctor, pharmacist, or health care provider.  2020 Elsevier/Gold Standard (2017-12-29 13:19:55) Citalopram tablets What is this medicine? CITALOPRAM (sye TAL oh pram) is a medicine for depression. This medicine may be used for other purposes; ask your health care provider or pharmacist if you have questions. COMMON BRAND NAME(S): Celexa What should I tell my health care provider before I take this medicine? They need to know if you have any of these  conditions:  bleeding disorders  bipolar disorder or a family history of bipolar disorder  glaucoma  heart disease  history of irregular heartbeat  kidney disease  liver disease  low levels of magnesium or potassium in the blood  receiving electroconvulsive therapy  seizures  suicidal thoughts, plans, or attempt; a previous suicide attempt by you or a family member  take medicines that treat or prevent blood clots  thyroid disease  an unusual or allergic reaction to citalopram, escitalopram, other medicines, foods, dyes, or preservatives  pregnant or trying to become pregnant  breast-feeding How should I use this medicine? Take this medicine by mouth with a glass of water. Follow the directions on the prescription label. You can take it with or without food. Take your medicine at regular intervals. Do not take your medicine more often than directed. Do not stop taking this medicine suddenly except upon the advice of your doctor. Stopping this medicine too quickly may cause serious side effects or your condition may worsen. A special MedGuide will be given to you by the pharmacist with each prescription and refill. Be sure to read this information carefully each time. Talk to your pediatrician regarding the use of this medicine in children. Special care may be needed. Patients over 59 years old may have a stronger reaction and need a smaller dose. Overdosage: If you think you have  taken too much of this medicine contact a poison control center or emergency room at once. NOTE: This medicine is only for you. Do not share this medicine with others. What if I miss a dose? If you miss a dose, take it as soon as you can. If it is almost time for your next dose, take only that dose. Do not take double or extra doses. What may interact with this medicine? Do not take this medicine with any of the following medications:  certain medicines for fungal infections like fluconazole,  itraconazole, ketoconazole, posaconazole, voriconazole  cisapride  dronedarone  escitalopram  linezolid  MAOIs like Carbex, Eldepryl, Marplan, Nardil, and Parnate  methylene blue (injected into a vein)  pimozide  thioridazine This medicine may also interact with the following medications:  alcohol  amphetamines  aspirin and aspirin-like medicines  carbamazepine  certain medicines for depression, anxiety, or psychotic disturbances  certain medicines for infections like chloroquine, clarithromycin, erythromycin, furazolidone, isoniazid, pentamidine  certain medicines for migraine headaches like almotriptan, eletriptan, frovatriptan, naratriptan, rizatriptan, sumatriptan, zolmitriptan  certain medicines for sleep  certain medicines that treat or prevent blood clots like dalteparin, enoxaparin, warfarin  cimetidine  diuretics  dofetilide  fentanyl  lithium  methadone  metoprolol  NSAIDs, medicines for pain and inflammation, like ibuprofen or naproxen  omeprazole  other medicines that prolong the QT interval (cause an abnormal heart rhythm)  procarbazine  rasagiline  supplements like St. John's wort, kava kava, valerian  tramadol  tryptophan  ziprasidone This list may not describe all possible interactions. Give your health care provider a list of all the medicines, herbs, non-prescription drugs, or dietary supplements you use. Also tell them if you smoke, drink alcohol, or use illegal drugs. Some items may interact with your medicine. What should I watch for while using this medicine? Tell your doctor if your symptoms do not get better or if they get worse. Visit your doctor or health care professional for regular checks on your progress. Because it may take several weeks to see the full effects of this medicine, it is important to continue your treatment as prescribed by your doctor. Patients and their families should watch out for new or worsening  thoughts of suicide or depression. Also watch out for sudden changes in feelings such as feeling anxious, agitated, panicky, irritable, hostile, aggressive, impulsive, severely restless, overly excited and hyperactive, or not being able to sleep. If this happens, especially at the beginning of treatment or after a change in dose, call your health care professional. Dennis Bast may get drowsy or dizzy. Do not drive, use machinery, or do anything that needs mental alertness until you know how this medicine affects you. Do not stand or sit up quickly, especially if you are an older patient. This reduces the risk of dizzy or fainting spells. Alcohol may interfere with the effect of this medicine. Avoid alcoholic drinks. Your mouth may get dry. Chewing sugarless gum or sucking hard candy, and drinking plenty of water will help. Contact your doctor if the problem does not go away or is severe. What side effects may I notice from receiving this medicine? Side effects that you should report to your doctor or health care professional as soon as possible:  allergic reactions like skin rash, itching or hives, swelling of the face, lips, or tongue  anxious  black, tarry stools  breathing problems  changes in vision  chest pain  confusion  elevated mood, decreased need for sleep, racing thoughts, impulsive behavior  eye pain  fast, irregular heartbeat  feeling faint or lightheaded, falls  feeling agitated, angry, or irritable  hallucination, loss of contact with reality  loss of balance or coordination  loss of memory  painful or prolonged erections  restlessness, pacing, inability to keep still  seizures  stiff muscles  suicidal thoughts or other mood changes  trouble sleeping  unusual bleeding or bruising  unusually weak or tired  vomiting Side effects that usually do not require medical attention (report to your doctor or health care professional if they continue or are  bothersome):  change in appetite or weight  change in sex drive or performance  dizziness  headache  increased sweating  indigestion, nausea  tremors This list may not describe all possible side effects. Call your doctor for medical advice about side effects. You may report side effects to FDA at 1-800-FDA-1088. Where should I keep my medicine? Keep out of reach of children. Store at room temperature between 15 and 30 degrees C (59 and 86 degrees F). Throw away any unused medicine after the expiration date. NOTE: This sheet is a summary. It may not cover all possible information. If you have questions about this medicine, talk to your doctor, pharmacist, or health care provider.  2020 Elsevier/Gold Standard (2017-12-29 09:05:36)

## 2018-09-15 ENCOUNTER — Other Ambulatory Visit: Payer: Self-pay

## 2018-09-15 ENCOUNTER — Ambulatory Visit (INDEPENDENT_AMBULATORY_CARE_PROVIDER_SITE_OTHER): Payer: 59 | Admitting: Licensed Clinical Social Worker

## 2018-09-15 ENCOUNTER — Encounter: Payer: Self-pay | Admitting: Licensed Clinical Social Worker

## 2018-09-15 DIAGNOSIS — F411 Generalized anxiety disorder: Secondary | ICD-10-CM

## 2018-09-15 NOTE — Progress Notes (Signed)
Virtual Visit via Video Note  I connected with [name] on 09/15/18 at  8:00 AM EDT by a video enabled telemedicine application and verified that I am speaking with the correct person using two identifiers.   I discussed the limitations of evaluation and management by telemedicine and the availability of in person appointments. The patient expressed understanding and agreed to proceed.    I discussed the assessment and treatment plan with the patient. The patient was provided an opportunity to ask questions and all were answered. The patient agreed with the plan and demonstrated an understanding of the instructions.   The patient was advised to call back or seek an in-person evaluation if the symptoms worsen or if the condition fails to improve as anticipated.  I provided 45 minutes of non-face-to-face time during this encounter.   Alden Hipp, LCSW    THERAPIST PROGRESS NOTE  Session Time: 0800  Participation Level: Active  Behavioral Response: CasualAlertAnxious  Type of Therapy: Individual Therapy  Treatment Goals addressed: Anxiety  Interventions: CBT  Summary: [name] is a 58 y.o. female who presents with continued symptoms related to her diagnosis. Verenis was originally seen by another therapist in the clinic, so today's session was utilized to build rapport and review important events in Skyllar's life that led her where she is now. Dennie reported having a good childhood and stated, "I have no complaints. We weren't rich but we had everything we needed." She stated she does not have any children, but is married and this is her second marriage. She reports they have been married for four years, and he recently moved out due to marital problems. She reported feeling she is quick to anger, and says things without thinking. Grecia reported that is a primary stressor in her marriage, "he tells me he just couldn't deal with me anymore." LCSW encouraged Timmie to recognize  marriages rarely end due to the actions of one person, so it is more important to reflect on how she contributed but not productive to take on full responsibility. Shallen expressed understanding and agreement, and reported she has felt the same way. She reported feeling too opinionated at times, and stated her husband stopped discussing things with her because of how she would react. LCSW encouraged Dajiah to recognize no one should take away her ability to change behavior by not giving her the opportunity to do so. Cloe expressed understanding of this idea as well. To address Loneta's anger, LCSW asked her to come up with a "code word," she and her husband can utilize when she needs a moment to gather her thoughts in an argument or conversation. At that time, LCSW instructed Kimberli to take a breath, walk away, and write down what is making her angry.  Lorrene expressed understanding and agreement with this idea and request.   Suicidal/Homicidal: No  Therapist Response: Sonnie Alamo continues to work towards her tx goals but has not yet reached them. We will continue to work on emotional regulation skills and improving communication.   Plan: Return again in 2 weeks.  Diagnosis: Axis I: Generalized Anxiety Disorder    Axis II: No diagnosis    Alden Hipp, LCSW 09/15/2018

## 2018-09-22 ENCOUNTER — Encounter: Payer: Self-pay | Admitting: Psychiatry

## 2018-09-22 ENCOUNTER — Other Ambulatory Visit: Payer: Self-pay

## 2018-09-22 ENCOUNTER — Ambulatory Visit (INDEPENDENT_AMBULATORY_CARE_PROVIDER_SITE_OTHER): Payer: 59 | Admitting: Psychiatry

## 2018-09-22 DIAGNOSIS — R41840 Attention and concentration deficit: Secondary | ICD-10-CM | POA: Diagnosis not present

## 2018-09-22 DIAGNOSIS — F411 Generalized anxiety disorder: Secondary | ICD-10-CM

## 2018-09-22 NOTE — Progress Notes (Signed)
Virtual Visit via Video Note  I connected with [name] on 09/22/18 at  9:45 AM EDT by a video enabled telemedicine application and verified that I am speaking with the correct person using two identifiers.   I discussed the limitations of evaluation and management by telemedicine and the availability of in person appointments. The patient expressed understanding and agreed to proceed.  I discussed the assessment and treatment plan with the patient. The patient was provided an opportunity to ask questions and all were answered. The patient agreed with the plan and demonstrated an understanding of the instructions.   The patient was advised to call back or seek an in-person evaluation if the symptoms worsen or if the condition fails to improve as anticipated.  BH MD OP Progress Note  09/22/2018 12:22 PM [name]  MRN:  [id number]  Chief Complaint:  Chief Complaint    Follow-up     HPI: [name] is a 58 year old Caucasian female who is married, employed, lives in [place], has a history of generalized anxiety disorder attention and focus problems was evaluated by telemedicine today.  Patient today reports she is compliant on her Celexa.  She increased her Celexa to 10 mg a week ago.  She reports she does have mild constipation with the Celexa however she has been able to cope with it so far.  She denies any other side effects.  She has definitely noticed an improvement in her anxiety symptoms.  She reports she is not as shaky or nervous as she used to be before.  Her husband has also noticed a change in her mood.  She reports she continues to struggle with attention and focus problems.  She however reports that may have improved since being on the Celexa slightly.  She however continues to struggle with problems at work with her focus.  Patient is interested in ADHD testing.  We will refer her to [place] attention specialist.  Patient reports sleep is good.  Patient denies any  suicidality, homicidality or perceptual disturbances.  Patient reports she and her husband are planning a road trip to [place] and she is excited about that.  Patient denies any other concerns today. Visit Diagnosis:    ICD-10-CM   1. GAD (generalized anxiety disorder)  F41.1   2. Attention and concentration deficit  R41.840     Past Psychiatric History: I have reviewed past psychiatric history from my progress note on 08/27/2018.  Past Medical History:  Past Medical History:  Diagnosis Date  . Anxiety     Past Surgical History:  Procedure Laterality Date  . TONSILLECTOMY      Family Psychiatric History: I have reviewed family psychiatric history from my progress note on 08/27/2018.  Family History:  Family History  Problem Relation Age of Onset  . Breast cancer Neg Hx   . Mental illness Neg Hx     Social History: I have reviewed social history from my progress note on 08/27/2018. Social History   Socioeconomic History  . Marital status: Married    Spouse name: Not on file  . Number of children: 0  . Years of education: Not on file  . Highest education level: High school graduate  Occupational History  . Occupation: hairdresser  Social Needs  . Financial resource strain: Not hard at all  . Food insecurity    Worry: Never true    Inability: Never true  . Transportation needs    Medical: No    Non-medical: No  Tobacco Use  .  Smoking status: Former Research scientist (life sciences)  . Smokeless tobacco: Never Used  Substance and Sexual Activity  . Alcohol use: Not Currently  . Drug use: Never  . Sexual activity: Yes    Partners: Male  Lifestyle  . Physical activity    Days per week: 7 days    Minutes per session: 60 min  . Stress: Very much  Relationships  . Social Herbalist on phone: Never    Gets together: Never    Attends religious service: 1 to 4 times per year    Active member of club or organization: No    Attends meetings of clubs or organizations: Never     Relationship status: Married  Other Topics Concern  . Not on file  Social History Narrative  . Not on file    Allergies:  Allergies  Allergen Reactions  . Codeine Nausea Only    Metabolic Disorder Labs: No results found for: HGBA1C, MPG No results found for: PROLACTIN No results found for: CHOL, TRIG, HDL, CHOLHDL, VLDL, LDLCALC No results found for: TSH  Therapeutic Level Labs: No results found for: LITHIUM No results found for: VALPROATE No components found for:  CBMZ  Current Medications: Current Outpatient Medications  Medication Sig Dispense Refill  . citalopram (CELEXA) 10 MG tablet Take 1 tablet (10 mg total) by mouth daily with breakfast. Start taking 5 mg for 2 weeks and increase to 10 mg 30 tablet 1  . hydrOXYzine (ATARAX/VISTARIL) 25 MG tablet Take 0.5-1 tablets (12.5-25 mg total) by mouth daily as needed for anxiety. For severe anxiety symptoms 30 tablet 1  . valACYclovir (VALTREX) 500 MG tablet      No current facility-administered medications for this visit.      Musculoskeletal: Strength & Muscle Tone: UTA Gait & Station: normal Patient leans: N/A  Psychiatric Specialty Exam: Review of Systems  Psychiatric/Behavioral: The patient is nervous/anxious.   All other systems reviewed and are negative.   There were no vitals taken for this visit.There is no height or weight on file to calculate BMI.  General Appearance: Casual  Eye Contact:  Fair  Speech:  Clear and Coherent  Volume:  Normal  Mood:  Anxious  Affect:  Congruent  Thought Process:  Goal Directed and Descriptions of Associations: Intact  Orientation:  Full (Time, Place, and Person)  Thought Content: Logical   Suicidal Thoughts:  No  Homicidal Thoughts:  No  Memory:  Immediate;   Fair Recent;   Fair Remote;   Fair  Judgement:  Fair  Insight:  Fair  Psychomotor Activity:  Normal  Concentration:  Concentration: Fair and Attention Span: Fair  Recall:  [employer] of Knowledge: Fair   Language: Fair  Akathisia:  No  Handed:  Right  AIMS (if indicated): Denies tremors, rigidity  Assets:  Communication Skills Desire for Improvement Housing Social Support  ADL's:  Intact  Cognition: WNL  Sleep:  Fair   Screenings: GAD-7     Office Visit from 08/27/2018 in [place]  Total GAD-7 Score  14       Assessment and Plan: Gary is a 58 year old Caucasian female, married, employed, lives in [place], has a history of anxiety, attention problems was evaluated by telemedicine today.  Patient with psychosocial stressors of being separated from her husband, current pandemic.  She is currently making progress on the Celexa.  She however continues to struggle with attention and focus and will benefit from ADHD testing.  Plan GAD-some progress  Celexa 10 mg p.o. daily Hydroxyzine 12.5 to 25 mg p.o. daily PRN for severe anxiety attacks. Continue psychotherapy sessions with Ms. Heidi Dach.   Rule out ADHD-we will refer patient to [facility] attention specialist today.  Follow-up in clinic in 4 weeks or sooner if needed.  October 1 at 10:30 AM  I have spent atleast 25 minutes non face to face with patient today. More than 50 % of the time was spent for psychoeducation and supportive psychotherapy and care coordination. This note was generated in part or whole with voice recognition software. Voice recognition is usually quite accurate but there are transcription errors that can and very often do occur. I apologize for any typographical errors that were not detected and corrected.      Jomarie Longs, MD 09/22/2018, 12:22 PM

## 2018-10-07 ENCOUNTER — Other Ambulatory Visit: Payer: Self-pay

## 2018-10-07 ENCOUNTER — Encounter: Payer: Self-pay | Admitting: Licensed Clinical Social Worker

## 2018-10-07 ENCOUNTER — Ambulatory Visit (INDEPENDENT_AMBULATORY_CARE_PROVIDER_SITE_OTHER): Payer: 59 | Admitting: Licensed Clinical Social Worker

## 2018-10-07 DIAGNOSIS — R41840 Attention and concentration deficit: Secondary | ICD-10-CM

## 2018-10-07 DIAGNOSIS — F411 Generalized anxiety disorder: Secondary | ICD-10-CM | POA: Diagnosis not present

## 2018-10-07 NOTE — Progress Notes (Signed)
Virtual Visit via Video Note  I connected with [name] on 10/07/18 at  9:00 AM EDT by a video enabled telemedicine application and verified that I am speaking with the correct person using two identifiers.   I discussed the limitations of evaluation and management by telemedicine and the availability of in person appointments. The patient expressed understanding and agreed to proceed.  I discussed the assessment and treatment plan with the patient. The patient was provided an opportunity to ask questions and all were answered. The patient agreed with the plan and demonstrated an understanding of the instructions.   The patient was advised to call back or seek an in-person evaluation if the symptoms worsen or if the condition fails to improve as anticipated.  I provided 53 minutes of non-face-to-face time during this encounter.   Alden Hipp, LCSW    THERAPIST PROGRESS NOTE  Session Time: 0900  Participation Level: Active  Behavioral Response: NeatAlertAnxious  Type of Therapy: Individual Therapy  Treatment Goals addressed: Coping  Interventions: Supportive  Summary: [name] is a 58 y.o. female who presents with continued symptoms related to her diagnosis. [name] reports doing well since our last session. She reports she only had one situation where she felt she responded poorly. She reported being in a hotel in [place], and the staff members were being very unhelpful, to which [name] made a comment, "You could have helped [place], idiot!" LCSW validated [name]'s frustration and reflection on the event. LCSW highlighted there were worse things she could have said, and that was not as aggressive as it could have been. We discussed ways to manage those thoughts, and how to put more time inbetween thinking something and saying it. We also discussed how anxiety and ADHD can play a role in those behaviors. Further, she discussed her relationship with her husband. She reports they  often fight when he is not behaving in a way she feels he should, or has not done something when she's asked him to do it. LCSW encouraged [name] to have conversations with her husband in order to provide more information. For example, instead of saying "hey can you clean the kitchen?" Instead say, "Hi, can you clean the kitchen before I start cooking dinner?" This provides him a timeline, which will help both parties involved. LCSW also encouraged [name] to ask her husband to provide more information as well, "yes, I will clean the kitchen in the next half hour." Andalyn expressed understanding and agreement with this information. We walked through other examples of how this idea could be utilized as well. [name] reported she will only be able to do monthly therapy sessions, due to finances. LCSW expressed understanding and encouraged [name] to write things down between sessions.  Suicidal/Homicidal: No   Therapist Response: [name] continues to work towards her tx goals but has not yet reached them. We will continue to work on emotional regulation skills and improving communication in relationships.   Plan: Return again in 4 weeks.  Diagnosis: Axis I: Generalized Anxiety Disorder    Axis II: No diagnosis    Alden Hipp, LCSW 10/07/2018

## 2018-10-22 ENCOUNTER — Other Ambulatory Visit: Payer: Self-pay

## 2018-10-22 ENCOUNTER — Encounter: Payer: Self-pay | Admitting: Psychiatry

## 2018-10-22 ENCOUNTER — Other Ambulatory Visit: Payer: Self-pay | Admitting: Psychiatry

## 2018-10-22 ENCOUNTER — Ambulatory Visit (INDEPENDENT_AMBULATORY_CARE_PROVIDER_SITE_OTHER): Payer: 59 | Admitting: Psychiatry

## 2018-10-22 DIAGNOSIS — F411 Generalized anxiety disorder: Secondary | ICD-10-CM

## 2018-10-22 DIAGNOSIS — R41840 Attention and concentration deficit: Secondary | ICD-10-CM

## 2018-10-22 MED ORDER — CITALOPRAM HYDROBROMIDE 10 MG PO TABS: 10.0000 mg | ORAL_TABLET | Freq: Every day | ORAL | 2 refills | Status: DC

## 2018-10-22 NOTE — Progress Notes (Signed)
Virtual Visit via Video Note  I connected with [name] on 10/22/18 at 10:30 AM EDT by a video enabled telemedicine application and verified that I am speaking with the correct person using two identifiers.   I discussed the limitations of evaluation and management by telemedicine and the availability of in person appointments. The patient expressed understanding and agreed to proceed.    I discussed the assessment and treatment plan with the patient. The patient was provided an opportunity to ask questions and all were answered. The patient agreed with the plan and demonstrated an understanding of the instructions.   The patient was advised to call back or seek an in-person evaluation if the symptoms worsen or if the condition fails to improve as anticipated.  BH MD OP Progress Note  10/22/2018 12:08 PM [name]  MRN:  [id number]  Chief Complaint:  Chief Complaint    Follow-up     HPI: [name] is a 58 year old Caucasian female, married, employed, lives in [place], has a history of GAD, attention ,focus problem was evaluated by telemedicine today.  Patient reports she is tolerating the Celexa 10 mg well.  She reports her anxiety symptoms are more under control.  She reports sleep is good.  She reports she had couple of nights when she could not sleep due to some situational stressors.  She reports that night she took hydroxyzine 25 mg and she slept very well.  She denies any side effects to medications at this time.  She reports she believes her problems are currently more marital than anything else.  Her husband continues to stay in a different place however comes to visit her on and off.  She reports she does not know where the relationship is going.  He seems to blame everything on her.  Patient reports she is currently working with her therapist Ms. Heidi DachKelsey Craig and it is going well.  Patient reports she has upcoming appointment with attention specialist soon.  Patient  denies any other concerns today. Visit Diagnosis:    ICD-10-CM   1. GAD (generalized anxiety disorder)  F41.1 citalopram (CELEXA) 10 MG tablet  2. Attention and concentration deficit  R41.840     Past Psychiatric History: I have reviewed past psychiatric history from my progress note on 08/27/2018  Past Medical History:  Past Medical History:  Diagnosis Date  . Anxiety     Past Surgical History:  Procedure Laterality Date  . TONSILLECTOMY      Family Psychiatric History: I have reviewed family psychiatric history from my progress note on 08/27/2018  Family History:  Family History  Problem Relation Age of Onset  . Breast cancer Neg Hx   . Mental illness Neg Hx     Social History: I have reviewed social history from my progress note on 08/27/2018 Social History   Socioeconomic History  . Marital status: Married    Spouse name: Not on file  . Number of children: 0  . Years of education: Not on file  . Highest education level: High school graduate  Occupational History  . Occupation: hairdresser  Social Needs  . Financial resource strain: Not hard at all  . Food insecurity    Worry: Never true    Inability: Never true  . Transportation needs    Medical: No    Non-medical: No  Tobacco Use  . Smoking status: Former Games developermoker  . Smokeless tobacco: Never Used  Substance and Sexual Activity  . Alcohol use: Not Currently  . Drug  use: Never  . Sexual activity: Yes    Partners: Male  Lifestyle  . Physical activity    Days per week: 7 days    Minutes per session: 60 min  . Stress: Very much  Relationships  . Social Herbalist on phone: Never    Gets together: Never    Attends religious service: 1 to 4 times per year    Active member of club or organization: No    Attends meetings of clubs or organizations: Never    Relationship status: Married  Other Topics Concern  . Not on file  Social History Narrative  . Not on file    Allergies:  Allergies   Allergen Reactions  . Codeine Nausea Only    Metabolic Disorder Labs: No results found for: HGBA1C, MPG No results found for: PROLACTIN No results found for: CHOL, TRIG, HDL, CHOLHDL, VLDL, LDLCALC No results found for: TSH  Therapeutic Level Labs: No results found for: LITHIUM No results found for: VALPROATE No components found for:  CBMZ  Current Medications: Current Outpatient Medications  Medication Sig Dispense Refill  . citalopram (CELEXA) 10 MG tablet Take 1 tablet (10 mg total) by mouth daily with breakfast. 30 tablet 2  . hydrOXYzine (ATARAX/VISTARIL) 25 MG tablet Take 0.5-1 tablets (12.5-25 mg total) by mouth daily as needed for anxiety. For severe anxiety symptoms 30 tablet 1  . valACYclovir (VALTREX) 500 MG tablet      No current facility-administered medications for this visit.      Musculoskeletal: Strength & Muscle Tone: UTA Gait & Station: normal Patient leans: N/A  Psychiatric Specialty Exam: Review of Systems  Psychiatric/Behavioral: Negative for hallucinations and suicidal ideas. The patient is not nervous/anxious.   All other systems reviewed and are negative.   There were no vitals taken for this visit.There is no height or weight on file to calculate BMI.  General Appearance: Casual  Eye Contact:  Fair  Speech:  Clear and Coherent  Volume:  Normal  Mood:  Euthymic  Affect:  Appropriate  Thought Process:  Goal Directed and Descriptions of Associations: Intact  Orientation:  Full (Time, Place, and Person)  Thought Content: Logical   Suicidal Thoughts:  No  Homicidal Thoughts:  No  Memory:  Immediate;   Fair Recent;   Fair Remote;   Fair  Judgement:  Fair  Insight:  Fair  Psychomotor Activity:  Normal  Concentration:  Concentration: Fair and Attention Span: Fair  Recall:  [employer] of Knowledge: Fair  Language: Fair  Akathisia:  No  Handed:  Right  AIMS (if indicated): Denies tremors, stiffness  Assets:  Communication Skills Desire  for Improvement Social Support  ADL's:  Intact  Cognition: WNL  Sleep:  Fair   Screenings: GAD-7     Office Visit from 08/27/2018 in [facility]  Total GAD-7 Score  14       Assessment and Plan: [name] is a 58 year old Caucasian female, married, employed, lives in [place], has a history of anxiety , attention problem was evaluated by telemedicine patient with psychosocial stressors of relationship struggles with her husband, separation from her husband and current pandemic.  She however is currently making progress on the medications.  She will continue plan as noted below.  Plan GAD-improving Celexa 10 mg p.o. daily Hydroxyzine 12.5 to 25 mg p.o. daily as needed for anxiety attacks Continue psychotherapy sessions with Ms. Alden Hipp.  Rule out ADHD-patient was referred to [place] attention specialist-  has upcoming appointment.  Follow-up in clinic in 6 weeks or sooner if needed.  November 12 at 10:30 AM  I have spent atleast 15 minutes non face to face with patient today. More than 50 % of the time was spent for psychoeducation and supportive psychotherapy and care coordination. This note was generated in part or whole with voice recognition software. Voice recognition is usually quite accurate but there are transcription errors that can and very often do occur. I apologize for any typographical errors that were not detected and corrected.         Jomarie Longs, MD 10/22/2018, 12:08 PM

## 2018-10-23 ENCOUNTER — Other Ambulatory Visit: Payer: Self-pay | Admitting: Psychiatry

## 2018-10-23 DIAGNOSIS — F411 Generalized anxiety disorder: Secondary | ICD-10-CM

## 2018-11-11 ENCOUNTER — Ambulatory Visit: Payer: 59 | Admitting: Licensed Clinical Social Worker

## 2018-11-11 ENCOUNTER — Other Ambulatory Visit: Payer: Self-pay

## 2018-12-03 ENCOUNTER — Encounter: Payer: Self-pay | Admitting: Psychiatry

## 2018-12-03 ENCOUNTER — Other Ambulatory Visit: Payer: Self-pay

## 2018-12-03 ENCOUNTER — Ambulatory Visit (INDEPENDENT_AMBULATORY_CARE_PROVIDER_SITE_OTHER): Payer: 59 | Admitting: Psychiatry

## 2018-12-03 DIAGNOSIS — F411 Generalized anxiety disorder: Secondary | ICD-10-CM | POA: Diagnosis not present

## 2018-12-03 DIAGNOSIS — R41840 Attention and concentration deficit: Secondary | ICD-10-CM | POA: Diagnosis not present

## 2018-12-03 NOTE — Progress Notes (Signed)
Virtual Visit via Video Note  I connected with [name] on 12/03/18 at  3:00 PM EST by a video enabled telemedicine application and verified that I am speaking with the correct person using two identifiers.   I discussed the limitations of evaluation and management by telemedicine and the availability of in person appointments. The patient expressed understanding and agreed to proceed.    I discussed the assessment and treatment plan with the patient. The patient was provided an opportunity to ask questions and all were answered. The patient agreed with the plan and demonstrated an understanding of the instructions.   The patient was advised to call back or seek an in-person evaluation if the symptoms worsen or if the condition fails to improve as anticipated.  BH MD OP Progress Note  12/03/2018 3:34 PM [name]  MRN:  [id number]  Chief Complaint:  Chief Complaint    Follow-up     HPI: [name] is a 58 year old Caucasian [name], [name], [name], [name], has a history of GAD, attention and focus problems was evaluated by telemedicine today.  Patient today reports she is making progress with regards to her anxiety symptoms.  She is not on edge or as nervous as she used to be before.  The Celexa does help her.  She denies side effects to the Celexa.  She is compliant on the medication.  Patient reports she had her testing done at [facility].  She reports she was told that she does not have ADHD symptoms.  Patient reports she definitely feels like she has improved.  She reports she is currently working on her relationship with her husband and he has agreed to make some changes.  Patient reports there are some days when she has some sleep problems however that is more likely due to her not being active as she used to be before due to the pandemic.  Patient denies any suicidality, homicidality or perceptual disturbances.  Patient therapist also  decided to have therapy sessions as needed since she has made progress.   Visit Diagnosis:    ICD-10-CM   1. GAD (generalized anxiety disorder)  F41.1    improving  2. Attention and concentration deficit  R41.840     Past Psychiatric History: Reviewed past psychiatric history from my progress note on 08/27/2018  Past Medical History:  Past Medical History:  Diagnosis Date  . Anxiety     Past Surgical History:  Procedure Laterality Date  . TONSILLECTOMY      Family Psychiatric History: Reviewed family psychiatric history from my progress note on 08/27/2018  Family History:  Family History  Problem Relation Age of Onset  . Breast cancer Neg Hx   . Mental illness Neg Hx     Social History: Reviewed social history from my progress note on 08/27/2018 Social History   Socioeconomic History  . Marital status: [name]    Spouse name: Not on file  . Number of children: 0  . Years of education: Not on file  . Highest education level: High school graduate  Occupational History  . Occupation: hairdresser  Social Needs  . Financial resource strain: Not hard at all  . Food insecurity    Worry: Never true    Inability: Never true  . Transportation needs    Medical: No    Non-medical: No  Tobacco Use  . Smoking status: Former Games developer  . Smokeless tobacco: Never Used  Substance and Sexual Activity  . Alcohol use: Not Currently  .  Drug use: Never  . Sexual activity: Yes    Partners: Male  Lifestyle  . Physical activity    Days per week: 7 days    Minutes per session: 60 min  . Stress: Very much  Relationships  . Social Herbalist on phone: Never    Gets together: Never    Attends religious service: 1 to 4 times per year    Active member of club or organization: No    Attends meetings of clubs or organizations: Never    Relationship status: [name]  Other Topics Concern  . Not on file  Social History Narrative  . Not on file    Allergies:  Allergies   Allergen Reactions  . Codeine Nausea Only    Metabolic Disorder Labs: No results found for: HGBA1C, MPG No results found for: PROLACTIN No results found for: CHOL, TRIG, HDL, CHOLHDL, VLDL, LDLCALC No results found for: TSH  Therapeutic Level Labs: No results found for: LITHIUM No results found for: VALPROATE No components found for:  CBMZ  Current Medications: Current Outpatient Medications  Medication Sig Dispense Refill  . citalopram (CELEXA) 10 MG tablet Take 1 tablet (10 mg total) by mouth daily with breakfast. 30 tablet 2  . hydrOXYzine (ATARAX/VISTARIL) 25 MG tablet Take 0.5-1 tablets (12.5-25 mg total) by mouth daily as needed for anxiety. For severe anxiety symptoms 30 tablet 1  . valACYclovir (VALTREX) 500 MG tablet      No current facility-administered medications for this visit.      Musculoskeletal: Strength & Muscle Tone: UTA Gait & Station: normal Patient leans: N/A  Psychiatric Specialty Exam: Review of Systems  Psychiatric/Behavioral: Negative for depression, hallucinations, substance abuse and suicidal ideas. The patient is not nervous/anxious.   All other systems reviewed and are negative.   There were no vitals taken for this visit.There is no height or weight on file to calculate BMI.  General Appearance: Casual  Eye Contact:  Fair  Speech:  Clear and Coherent  Volume:  Normal  Mood:  Euthymic  Affect:  Congruent  Thought Process:  Goal Directed and Descriptions of Associations: Intact  Orientation:  Full (Time, Place, and Person)  Thought Content: Logical   Suicidal Thoughts:  No  Homicidal Thoughts:  No  Memory:  Immediate;   Fair Recent;   Fair Remote;   Fair  Judgement:  Fair  Insight:  Fair  Psychomotor Activity:  Normal  Concentration:  Concentration: Fair and Attention Span: Fair  Recall:  [employer] of Knowledge: Fair  Language: Fair  Akathisia:  No  Handed:  Right  AIMS (if indicated): Denies tremors, rigidity  Assets:   Communication Skills Desire for Improvement Housing Social Support  ADL's:  Intact  Cognition: WNL  Sleep:  Fair   Screenings: GAD-7     Office Visit from 08/27/2018 in [place]  Total GAD-7 Score  14       Assessment and Plan: Kineta is a 58 year old Caucasian [name], [name], [name], Ruth in [place], has a history of anxiety, attention problems was evaluated by telemedicine today.  Patient with psychosocial stressors of relationship struggles with her husband, separation from her husband and the current pandemic.  Patient is currently making progress on the current medication regimen.  Plan as noted below.  Plan GAD-improving Celexa 10 mg p.o. daily Hydroxyzine 12.5 to 25 mg p.o. daily as needed for anxiety attacks Continue psychotherapy sessions with Ms. Alden Hipp  Rule out ADHD-patient was  referred to [facility] [facility]-patient reports she had testing done-pending report.  Follow-up in clinic in 1 to 2 months or sooner if needed.  December 22 at 3 PM  I have spent atleast 15 minutes non face to face with patient today. More than 50 % of the time was spent for psychoeducation and supportive psychotherapy and care coordination. This note was generated in part or whole with voice recognition software. Voice recognition is usually quite accurate but there are transcription errors that can and very often do occur. I apologize for any typographical errors that were not detected and corrected.       Jomarie LongsSaramma Katelyn Broadnax, MD 12/03/2018, 3:34 PM

## 2018-12-08 ENCOUNTER — Telehealth: Payer: Self-pay

## 2018-12-08 NOTE — Telephone Encounter (Signed)
received the [place] [facility] specialist report. - check your basket.  pt had to contact because they kept saying that we needed a release even thou we were the referring md.  pt called and signed release

## 2018-12-08 NOTE — Telephone Encounter (Signed)
OK THANKS   

## 2018-12-28 ENCOUNTER — Telehealth: Payer: Self-pay

## 2018-12-28 DIAGNOSIS — F411 Generalized anxiety disorder: Secondary | ICD-10-CM

## 2018-12-28 MED ORDER — CITALOPRAM HYDROBROMIDE 10 MG PO TABS: 15.0000 mg | ORAL_TABLET | Freq: Every day | ORAL | 1 refills | Status: DC

## 2018-12-28 NOTE — Telephone Encounter (Signed)
pt called left message that she would like to increase her medicationand she also needs refills.

## 2018-12-28 NOTE — Addendum Note (Signed)
Addended byUrsula Alert on: 12/28/2018 03:25 PM   Modules accepted: Orders

## 2018-12-28 NOTE — Telephone Encounter (Signed)
Received request to increase her Celexa -attempted calling her back. Left message that her Celexa can be increased to 15 mg p.o. daily with breakfast  We will send a new prescription to the pharmacy today.

## 2019-01-12 ENCOUNTER — Ambulatory Visit (INDEPENDENT_AMBULATORY_CARE_PROVIDER_SITE_OTHER): Payer: 59 | Admitting: Psychiatry

## 2019-01-12 ENCOUNTER — Other Ambulatory Visit: Payer: Self-pay

## 2019-01-12 ENCOUNTER — Encounter: Payer: Self-pay | Admitting: Psychiatry

## 2019-01-12 DIAGNOSIS — F411 Generalized anxiety disorder: Secondary | ICD-10-CM | POA: Diagnosis not present

## 2019-01-12 DIAGNOSIS — R41840 Attention and concentration deficit: Secondary | ICD-10-CM | POA: Diagnosis not present

## 2019-01-12 NOTE — Progress Notes (Signed)
Virtual Visit via Video Note  I connected with [name] on 01/12/19 at  3:00 PM EST by a video enabled telemedicine application and verified that I am speaking with the correct person using two identifiers.   I discussed the limitations of evaluation and management by telemedicine and the availability of in person appointments. The patient expressed understanding and agreed to proceed.    I discussed the assessment and treatment plan with the patient. The patient was provided an opportunity to ask questions and all were answered. The patient agreed with the plan and demonstrated an understanding of the instructions.   The patient was advised to call back or seek an in-person evaluation if the symptoms worsen or if the condition fails to improve as anticipated.    [place] MD OP Progress Note  01/12/2019 5:47 PM [name]  MRN:  [id number]  Chief Complaint:  Chief Complaint    Follow-up     HPI: [name] is a 58 year old Caucasian female, married, employed, lives in [place], has a history of GAD, attention and focus problem was evaluated by telemedicine today.  Patient today reports she is currently making progress with regards to her mood symptoms.  She reports the increase in Celexa dosage has helped.  She denies side effects.  She reports she is not as nervous as she used to before.  She is able to focus better.  Patient reports her husband has definitely noticed a change in her mood symptoms.  They are currently back together and working on the relationship.  Patient reports that also is a relief for her.  Patient continues to be in psychotherapy sessions and reports good benefits.  Patient denies any suicidality, homicidality or perceptual disturbances.  Patient denies any other concerns today. Visit Diagnosis:    ICD-10-CM   1. GAD (generalized anxiety disorder)  F41.1   2. Attention and concentration deficit  R41.840     Past Psychiatric History: I have reviewed  past psychiatric history from my progress note on 08/27/2018.  Past Medical History:  Past Medical History:  Diagnosis Date  . Anxiety     Past Surgical History:  Procedure Laterality Date  . TONSILLECTOMY      Family Psychiatric History: I have reviewed family psychiatric history from my progress note on 08/27/2018.  Family History:  Family History  Problem Relation Age of Onset  . Breast cancer Neg Hx   . Mental illness Neg Hx     Social History: I have reviewed social history from my progress note on 08/27/2018. Social History   Socioeconomic History  . Marital status: Married    Spouse name: Not on file  . Number of children: 0  . Years of education: Not on file  . Highest education level: High school graduate  Occupational History  . Occupation: hairdresser  Tobacco Use  . Smoking status: Former Research scientist (life sciences)  . Smokeless tobacco: Never Used  Substance and Sexual Activity  . Alcohol use: Not Currently  . Drug use: Never  . Sexual activity: Yes    Partners: Male  Other Topics Concern  . Not on file  Social History Narrative  . Not on file   Social Determinants of Health   Financial Resource Strain: Low Risk   . Difficulty of Paying Living Expenses: Not hard at all  Food Insecurity: No Food Insecurity  . Worried About Charity fundraiser in the Last Year: Never true  . Ran Out of Food in the Last Year: Never true  Transportation Needs: No Transportation Needs  . Lack of Transportation (Medical): No  . Lack of Transportation (Non-Medical): No  Physical Activity: Sufficiently Active  . Days of Exercise per Week: 7 days  . Minutes of Exercise per Session: 60 min  Stress: Stress Concern Present  . Feeling of Stress : Very much  Social Connections: Somewhat Isolated  . Frequency of Communication with Friends and Family: Never  . Frequency of Social Gatherings with Friends and Family: Never  . Attends Religious Services: 1 to 4 times per year  . Active Member of Clubs  or Organizations: No  . Attends BankerClub or Organization Meetings: Never  . Marital Status: Married    Allergies:  Allergies  Allergen Reactions  . Codeine Nausea Only    Metabolic Disorder Labs: No results found for: HGBA1C, MPG No results found for: PROLACTIN No results found for: CHOL, TRIG, HDL, CHOLHDL, VLDL, LDLCALC No results found for: TSH  Therapeutic Level Labs: No results found for: LITHIUM No results found for: VALPROATE No components found for:  CBMZ  Current Medications: Current Outpatient Medications  Medication Sig Dispense Refill  . citalopram (CELEXA) 10 MG tablet Take 1.5 tablets (15 mg total) by mouth daily with breakfast. 45 tablet 1  . hydrOXYzine (ATARAX/VISTARIL) 25 MG tablet Take 0.5-1 tablets (12.5-25 mg total) by mouth daily as needed for anxiety. For severe anxiety symptoms 30 tablet 1  . valACYclovir (VALTREX) 500 MG tablet      No current facility-administered medications for this visit.     Musculoskeletal: Strength & Muscle Tone: UTA Gait & Station: normal Patient leans: N/A  Psychiatric Specialty Exam: Review of Systems  Psychiatric/Behavioral: Negative for agitation, behavioral problems, confusion, decreased concentration, dysphoric mood, hallucinations, self-injury and sleep disturbance. The patient is not nervous/anxious and is not hyperactive.   All other systems reviewed and are negative.   There were no vitals taken for this visit.There is no height or weight on file to calculate BMI.  General Appearance: Casual  Eye Contact:  Fair  Speech:  Clear and Coherent  Volume:  Normal  Mood:  Euthymic  Affect:  Congruent  Thought Process:  Goal Directed and Descriptions of Associations: Intact  Orientation:  Full (Time, Place, and Person)  Thought Content: Logical   Suicidal Thoughts:  No  Homicidal Thoughts:  No  Memory:  Immediate;   Fair Recent;   Fair Remote;   Fair  Judgement:  Fair  Insight:  Fair  Psychomotor Activity:   Normal  Concentration:  Concentration: Fair and Attention Span: Fair  Recall:  [employer]is[employer]Fair  Fund of Knowledge: Fair  Language: Fair  Akathisia:  No  Handed:  Right  AIMS (if indicated): Denies tremors, rigidity  Assets:  Communication Skills Desire for Improvement Housing Social Support  ADL's:  Intact  Cognition: WNL  Sleep:  Fair   Screenings: GAD-7     [facility]/27/2018 [facility]  Total GAD-7 Score  14       Assessment and Plan:[name] is a 58 year old Caucasian female, married, employed, lives in [place], has a history of anxiety, attention problems was evaluated by telemedicine today.  Patient with psychosocial stressors of relationship struggles is currently making progress.  Plan as noted below.  Plan GAD-improving Celexa 15 mg p.o. daily. Hydroxyzine 12.5 to 25 mg p.o. daily as needed for anxiety attacks Continue psychotherapy sessions with Ms. Heidi DachKelsey Craig  Rule out ADHD-patient was referred to [facility] [facility]-Per patient reports she currently does not  meet criteria for ADHD.  Pending report.  Follow-up in clinic in 1 to 2 months or sooner if needed.  January 26 at 2:30 PM  I have spent atleast 15 minutes non face to face with patient today. More than 50 % of the time was spent for psychoeducation and supportive psychotherapy and care coordination. This note was generated in part or whole with voice recognition software. Voice recognition is usually quite accurate but there are transcription errors that can and very often do occur. I apologize for any typographical errors that were not detected and corrected.       Jomarie Longs, MD 01/12/2019, 5:47 PM

## 2019-02-16 ENCOUNTER — Other Ambulatory Visit: Payer: Self-pay

## 2019-02-16 ENCOUNTER — Encounter: Payer: Self-pay | Admitting: Psychiatry

## 2019-02-16 ENCOUNTER — Ambulatory Visit (INDEPENDENT_AMBULATORY_CARE_PROVIDER_SITE_OTHER): Payer: 59 | Admitting: Psychiatry

## 2019-02-16 DIAGNOSIS — R41840 Attention and concentration deficit: Secondary | ICD-10-CM

## 2019-02-16 DIAGNOSIS — F411 Generalized anxiety disorder: Secondary | ICD-10-CM

## 2019-02-16 MED ORDER — CITALOPRAM HYDROBROMIDE 10 MG PO TABS: 15.0000 mg | ORAL_TABLET | Freq: Every day | ORAL | 3 refills | Status: DC

## 2019-02-16 NOTE — Progress Notes (Signed)
Provider Location : [facility] Patient Location : Home   Virtual Visit via Video Note  I connected with [name] on 02/16/19 at  2:30 PM EST by a video enabled telemedicine application and verified that I am speaking with the correct person using two identifiers.   I discussed the limitations of evaluation and management by telemedicine and the availability of in person appointments. The patient expressed understanding and agreed to proceed.     I discussed the assessment and treatment plan with the patient. The patient was provided an opportunity to ask questions and all were answered. The patient agreed with the plan and demonstrated an understanding of the instructions.   The patient was advised to call back or seek an in-person evaluation if the symptoms worsen or if the condition fails to improve as anticipated.  [facility] MD OP Progress Note  02/16/2019 5:33 PM [name]  MRN:  [id number]  Chief Complaint:  Chief Complaint    Follow-up     HPI: [name] is a 59 year old Caucasian female, married, employed, lives in [place], has a history of GAD, attention focus problem was evaluated by telemedicine today.  Patient today reports she is currently making progress on the current medication regimen.  She reports anxiety symptoms are under control.  She is compliant on citalopram.  She denies side effects.  She reports sleep and appetite is fair.  Patient reports she is currently working on her relationship with her husband and that is making progress.  He is happy about that.  She reports she has joined a Psychologist, occupational group and that has been helpful.  It gets her out of the home and she is able to associate with other people.  She reports she does struggle with some attention and focus at work.  She reports she works only 1 day a week and during that time she works long hours and sometimes get frustrated with her clients that could be affecting her focus.  Patient denies any  suicidality, homicidality or perceptual disturbances.  Patient denies any other concerns today. Visit Diagnosis:    ICD-10-CM   1. GAD (generalized anxiety disorder)  F41.1 citalopram (CELEXA) 10 MG tablet  2. Attention and concentration deficit  R41.840     Past Psychiatric History: I have reviewed past psychiatric history from my progress note on 08/27/2018.  Past Medical History:  Past Medical History:  Diagnosis Date  . Anxiety     Past Surgical History:  Procedure Laterality Date  . TONSILLECTOMY      Family Psychiatric History: I have reviewed family psychiatric history from my progress note on 08/27/2018.  Family History:  Family History  Problem Relation Age of Onset  . Breast cancer Neg Hx   . Mental illness Neg Hx     Social History: I have reviewed social history from my progress note on 08/27/2018. Social History   Socioeconomic History  . Marital status: Married    Spouse name: Not on file  . Number of children: 0  . Years of education: Not on file  . Highest education level: High school graduate  Occupational History  . Occupation: hairdresser  Tobacco Use  . Smoking status: Former Games developer  . Smokeless tobacco: Never Used  Substance and Sexual Activity  . Alcohol use: Not Currently  . Drug use: Never  . Sexual activity: Yes    Partners: Male  Other Topics Concern  . Not on file  Social History Narrative  . Not on file  Social Determinants of Health   Financial Resource Strain: Low Risk   . Difficulty of Paying Living Expenses: Not hard at all  Food Insecurity: No Food Insecurity  . Worried About Programme researcher, broadcasting/film/video in the Last Year: Never true  . Ran Out of Food in the Last Year: Never true  Transportation Needs: No Transportation Needs  . Lack of Transportation (Medical): No  . Lack of Transportation (Non-Medical): No  Physical Activity: Sufficiently Active  . Days of Exercise per Week: 7 days  . Minutes of Exercise per Session: 60 min   Stress: Stress Concern Present  . Feeling of Stress : Very much  Social Connections: Somewhat Isolated  . Frequency of Communication with Friends and Family: Never  . Frequency of Social Gatherings with Friends and Family: Never  . Attends Religious Services: 1 to 4 times per year  . Active Member of Clubs or Organizations: No  . Attends Banker Meetings: Never  . Marital Status: Married    Allergies:  Allergies  Allergen Reactions  . Codeine Nausea Only    Metabolic Disorder Labs: No results found for: HGBA1C, MPG No results found for: PROLACTIN No results found for: CHOL, TRIG, HDL, CHOLHDL, VLDL, LDLCALC No results found for: TSH  Therapeutic Level Labs: No results found for: LITHIUM No results found for: VALPROATE No components found for:  CBMZ  Current Medications: Current Outpatient Medications  Medication Sig Dispense Refill  . citalopram (CELEXA) 10 MG tablet Take 1.5 tablets (15 mg total) by mouth daily with breakfast. 45 tablet 3  . hydrOXYzine (ATARAX/VISTARIL) 25 MG tablet Take 0.5-1 tablets (12.5-25 mg total) by mouth daily as needed for anxiety. For severe anxiety symptoms 30 tablet 1  . valACYclovir (VALTREX) 500 MG tablet      No current facility-administered medications for this visit.     Musculoskeletal: Strength & Muscle Tone: UTA Gait & Station: normal Patient leans: N/A  Psychiatric Specialty Exam: Review of Systems  Psychiatric/Behavioral: Negative for agitation, behavioral problems, confusion, decreased concentration, dysphoric mood, hallucinations, self-injury, sleep disturbance and suicidal ideas. The patient is not nervous/anxious and is not hyperactive.   All other systems reviewed and are negative.   There were no vitals taken for this visit.There is no height or weight on file to calculate BMI.  General Appearance: Casual  Eye Contact:  Fair  Speech:  Clear and Coherent  Volume:  Normal  Mood:  Euthymic  Affect:   Congruent  Thought Process:  Goal Directed and Descriptions of Associations: Intact  Orientation:  Full (Time, Place, and Person)  Thought Content: Logical   Suicidal Thoughts:  No  Homicidal Thoughts:  No  Memory:  Immediate;   Fair Recent;   Fair Remote;   Fair  Judgement:  Fair  Insight:  Fair  Psychomotor Activity:  Normal  Concentration:  Concentration: Fair and Attention Span: Good  Recall:  Good  Fund of Knowledge: Fair  Language: Fair  Akathisia:  No  Handed:  Right  AIMS (if indicated):Denies tremors, rigidity  Assets:  Communication Skills Desire for Improvement Housing Social Support  ADL's:  Intact  Cognition: WNL  Sleep:  Fair   Screenings: GAD-7     [facility] Visit from 08/27/2018 in [facility]  Total GAD-7 Score  14       Assessment and Plan: [name] is a 59 year old Caucasian female, married, employed, lives in [place], has a history of anxiety, attention problems was evaluated by telemedicine today.  Patient  with psychosocial stressors of relationship struggles is currently making progress.  Plan as noted below.  Plan GAD-stable Celexa 15 mg p.o. daily Hydroxyzine 12.5 to 25 mg p.o. daily as needed for anxiety attacks  Rule out ADHD-patient had testing done at [place] [facility] and did not meet criteria for ADHD.  She continues to struggle with attention and focus more so because of work-related stressors.  We will continue to monitor closely.  She is not interested in psychotherapy sessions or medication changes today.  Follow-up in clinic in 3 months or sooner if needed.  May 4 at 2:20 PM  I have spent atleast 20 minutes non face to face with patient today. More than 50 % of the time was spent for  ordering medications and test ,psychoeducation and supportive psychotherapy and care coordination,as well as documenting clinical information in electronic health record. This note was generated in part or whole with  voice recognition software. Voice recognition is usually quite accurate but there are transcription errors that can and very often do occur. I apologize for any typographical errors that were not detected and corrected.       Ursula Alert, MD 02/16/2019, 5:33 PM

## 2019-05-25 ENCOUNTER — Encounter: Payer: Self-pay | Admitting: Psychiatry

## 2019-05-25 ENCOUNTER — Other Ambulatory Visit: Payer: Self-pay

## 2019-05-25 ENCOUNTER — Telehealth (INDEPENDENT_AMBULATORY_CARE_PROVIDER_SITE_OTHER): Payer: 59 | Admitting: Psychiatry

## 2019-05-25 DIAGNOSIS — R4189 Other symptoms and signs involving cognitive functions and awareness: Secondary | ICD-10-CM | POA: Diagnosis not present

## 2019-05-25 DIAGNOSIS — F411 Generalized anxiety disorder: Secondary | ICD-10-CM

## 2019-05-25 MED ORDER — CITALOPRAM HYDROBROMIDE 10 MG PO TABS: 10.0000 mg | ORAL_TABLET | Freq: Every day | ORAL | 2 refills | Status: DC

## 2019-05-25 NOTE — Progress Notes (Signed)
Provider Location : [facility] Patient Location : Home  Virtual Visit via Video Note  I connected with [name] on 05/25/19 at  2:20 PM EDT by a video enabled telemedicine application and verified that I am speaking with the correct person using two identifiers.   I discussed the limitations of evaluation and management by telemedicine and the availability of in person appointments. The patient expressed understanding and agreed to proceed.     I discussed the assessment and treatment plan with the patient. The patient was provided an opportunity to ask questions and all were answered. The patient agreed with the plan and demonstrated an understanding of the instructions.   The patient was advised to call back or seek an in-person evaluation if the symptoms worsen or if the condition fails to improve as anticipated.   [place] MD OP Progress Note  05/25/2019 3:16 PM [name]  MRN:  [id number]  Chief Complaint:  Chief Complaint    Follow-up     HPI: [name] is a 59 year old Caucasian female, married, employed, lives in [place], has a history of GAD, attention and focus problems was evaluated by telemedicine today.  Patient reports anxiety symptoms continues to be under control.  She is compliant on citalopram.  Patient however reports she does have memory problems on and off.  She reports she has some word finding difficulty.  She wonders whether the medication could be causing it.  Patient denies any other memory issues like having problems while driving, or functioning at work.  Patient reports her relationship with her husband as going well at this time.  He is planning to retire and they are planning on moving to [place] probably next year.  Patient denies any suicidality, homicidality or perceptual disturbances.  She reports sleep and appetite is fair.  Patient denies any other concerns today. Visit Diagnosis:    ICD-10-CM   1. GAD (generalized anxiety disorder)  F41.1  citalopram (CELEXA) 10 MG tablet  2. Cognitive change  R41.89 TSH    Vitamin B12    Past Psychiatric History: I have reviewed past psychiatric history from my progress note on 08/27/2018.  Past Medical History:  Past Medical History:  Diagnosis Date  . Anxiety     Past Surgical History:  Procedure Laterality Date  . TONSILLECTOMY      Family Psychiatric History: Reviewed family psychiatric history from my progress note on 08/27/2018.  Family History:  Family History  Problem Relation Age of Onset  . Breast cancer Neg Hx   . Mental illness Neg Hx     Social History: Reviewed social history from my progress note on 08/27/2018. Social History   Socioeconomic History  . Marital status: Married    Spouse name: Not on file  . Number of children: 0  . Years of education: Not on file  . Highest education level: High school graduate  Occupational History  . Occupation: hairdresser  Tobacco Use  . Smoking status: Former Research scientist (life sciences)  . Smokeless tobacco: Never Used  Substance and Sexual Activity  . Alcohol use: Not Currently  . Drug use: Never  . Sexual activity: Yes    Partners: Male  Other Topics Concern  . Not on file  Social History Narrative  . Not on file   Social Determinants of Health   Financial Resource Strain: Low Risk   . Difficulty of Paying Living Expenses: Not hard at all  Food Insecurity: No Food Insecurity  . Worried About Charity fundraiser in the  Last Year: Never true  . Ran Out of Food in the Last Year: Never true  Transportation Needs: No Transportation Needs  . Lack of Transportation (Medical): No  . Lack of Transportation (Non-Medical): No  Physical Activity: Sufficiently Active  . Days of Exercise per Week: 7 days  . Minutes of Exercise per Session: 60 min  Stress: Stress Concern Present  . Feeling of Stress : Very much  Social Connections: Somewhat Isolated  . Frequency of Communication with Friends and Family: Never  . Frequency of Social  Gatherings with Friends and Family: Never  . Attends Religious Services: 1 to 4 times per year  . Active Member of Clubs or Organizations: No  . Attends Banker Meetings: Never  . Marital Status: Married    Allergies:  Allergies  Allergen Reactions  . Codeine Nausea Only    Metabolic Disorder Labs: No results found for: HGBA1C, MPG No results found for: PROLACTIN No results found for: CHOL, TRIG, HDL, CHOLHDL, VLDL, LDLCALC No results found for: TSH  Therapeutic Level Labs: No results found for: LITHIUM No results found for: VALPROATE No components found for:  CBMZ  Current Medications: Current Outpatient Medications  Medication Sig Dispense Refill  . citalopram (CELEXA) 10 MG tablet Take 1 tablet (10 mg total) by mouth daily with breakfast. 30 tablet 2  . hydrOXYzine (ATARAX/VISTARIL) 25 MG tablet Take 0.5-1 tablets (12.5-25 mg total) by mouth daily as needed for anxiety. For severe anxiety symptoms 30 tablet 1  . valACYclovir (VALTREX) 500 MG tablet      No current facility-administered medications for this visit.     Musculoskeletal: Strength & Muscle Tone: UTA Gait & Station: normal Patient leans: N/A  Psychiatric Specialty Exam: Review of Systems  Psychiatric/Behavioral: Negative for agitation, behavioral problems, confusion, decreased concentration, dysphoric mood, hallucinations, self-injury, sleep disturbance and suicidal ideas. The patient is not nervous/anxious and is not hyperactive.   All other systems reviewed and are negative.   There were no vitals taken for this visit.There is no height or weight on file to calculate BMI.  General Appearance: Casual  Eye Contact:  Fair  Speech:  Clear and Coherent  Volume:  Normal  Mood:  Euthymic  Affect:  Congruent  Thought Process:  Goal Directed and Descriptions of Associations: Intact  Orientation:  Full (Time, Place, and Person)  Thought Content: Logical   Suicidal Thoughts:  No  Homicidal  Thoughts:  No  Memory:  Immediate;   Fair Recent;   Fair Remote;   Fair- Occasional word finding difficulty  Judgement:  Fair  Insight:  Fair  Psychomotor Activity:  Normal  Concentration:  Concentration: Fair and Attention Span: Fair  Recall:  [employer] of Knowledge: Fair  Language: Fair  Akathisia:  No  Handed:  Right  AIMS (if indicated): UTA  Assets:  Communication Skills Desire for Improvement Housing Social Support  ADL's:  Intact  Cognition: WNL  Sleep:  Fair   Screenings: GAD-7     [facility] 08/27/2018 in [facility]  Total GAD-7 Score  14       Assessment and Plan: [name] is a 59 year old Caucasian female, married, employed, lives in [place], has a history of anxiety, attention problems was evaluated by telemedicine today.  Patient is currently stable on medication however does report cognitive issues.  Plan as noted below.  Plan GAD-stable Celexa, will reduce to 10 mg p.o. daily.  Unknown if Celexa is contributing to memory problems.  She will monitor herself.   Cognitive disorder unspecified-unstable Will reduce Celexa as discussed above. We will order the following labs-TSH, vitamin B12. She will monitor herself and let writer know if she needs referral to neurology.  Follow-up in clinic in 2 months or sooner if needed.  I have spent atleast 20 minutes non  face to face with patient today. More than 50 % of the time was spent for preparing to see the patient ( e.g., review of test, records ), ordering medications and test ,psychoeducation and supportive psychotherapy and care coordination,as well as documenting clinical information in electronic health record. This note was generated in part or whole with voice recognition software. Voice recognition is usually quite accurate but there are transcription errors that can and very often do occur. I apologize for any typographical errors that were not detected and  corrected.       Jomarie Longs, MD 05/25/2019, 3:16 PM

## 2019-06-17 ENCOUNTER — Telehealth: Payer: Self-pay

## 2019-06-17 NOTE — Telephone Encounter (Signed)
Could you please call and give her Psychology today website , she could go there and put her zipcode and search up therapist in the area. If she wants intensive outpatient program , then she can be referred to [facility] [place]. Thanks.

## 2019-06-17 NOTE — Telephone Encounter (Signed)
pt states that she drinks and she wanted to know if there is anyone she recommends for her to see or talk too.

## 2019-06-22 ENCOUNTER — Telehealth: Payer: Self-pay | Admitting: Psychiatry

## 2019-06-22 NOTE — Telephone Encounter (Signed)
Returned call to patient today.  She reports she never got a call back about the psychology today information.  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  She reports she wants to stop since she is almost 59 years old and her husband hates it.  Provided her psychology today information.  Discussed with patient if that does not work then she can be started on medication.

## 2019-08-17 ENCOUNTER — Telehealth: Payer: 59 | Admitting: Psychiatry

## 2019-10-01 ENCOUNTER — Encounter: Payer: Self-pay | Admitting: Psychiatry

## 2019-10-01 ENCOUNTER — Telehealth (INDEPENDENT_AMBULATORY_CARE_PROVIDER_SITE_OTHER): Payer: 59 | Admitting: Psychiatry

## 2019-10-01 ENCOUNTER — Other Ambulatory Visit: Payer: Self-pay

## 2019-10-01 DIAGNOSIS — F411 Generalized anxiety disorder: Secondary | ICD-10-CM

## 2019-10-01 DIAGNOSIS — R4189 Other symptoms and signs involving cognitive functions and awareness: Secondary | ICD-10-CM

## 2019-10-01 NOTE — Progress Notes (Signed)
Provider Location : [facility] Patient Location : Home  Participants: Patient , Provider  Virtual Visit via Video Note  I connected with [name] on 10/01/19 at 10:00 AM EDT by a video enabled telemedicine application and verified that I am speaking with the correct person using two identifiers.   I discussed the limitations of evaluation and management by telemedicine and the availability of in person appointments. The patient expressed understanding and agreed to proceed.    I discussed the assessment and treatment plan with the patient. The patient was provided an opportunity to ask questions and all were answered. The patient agreed with the plan and demonstrated an understanding of the instructions.   The patient was advised to call back or seek an in-person evaluation if the symptoms worsen or if the condition fails to improve as anticipated.   BH MD OP Progress Note  10/01/2019 10:30 AM Mikki Ziff  MRN:  [id number]  Chief Complaint:  Chief Complaint    Follow-up     HPI: [name] is a 59 year old Caucasian female, married, employed, lives in [place], has a history of GAD, attention and focus problems was evaluated by telemedicine today.  Patient today reports she is currently doing well with regards to her anxiety.  She reports she is in the process of moving to [facility] for a few months and then planning to move out to [place] once her husband Midwife.  She looks forward to that.  She however reports the whole process is kind of anxiety provoking although it is manageable.  She continues to take the Celexa and currently takes the lower dosage.  The Celexa was reduced last visit since she was having some memory changes.  Patient reports she continues to have episodes of feeling foggy on and off.  She also has some name finding difficulties on and off.  She walks into a room and completely forgets why she went in there.  Then she has to pause and recollect however  it does come back to her.  She reports she spoke to her sister and her sister also has similar complaints.  She is worried about the fact that dementia does run in her family however they all had memory problems after they turned 80.  She reports currently these changes are not very distressing to her.  She agrees to monitor her symptoms closely.  Patient denies any suicidality, homicidality or perceptual disturbances.  Patient reports she currently does not have a therapist since her therapist is no longer at the practice.  She is planning to find a new therapist when she relocates.  Patient denies any other concerns today.    Visit Diagnosis:    ICD-10-CM   1. GAD (generalized anxiety disorder)  F41.1   2. Cognitive change  R41.89     Past Psychiatric History: I have reviewed past psychiatric history from my progress note on 08/27/2018  Past Medical History:  Past Medical History:  Diagnosis Date  . Anxiety     Past Surgical History:  Procedure Laterality Date  . TONSILLECTOMY      Family Psychiatric History: I have reviewed family psychiatric history from my progress note on 08/27/2018  Family History:  Family History  Problem Relation Age of Onset  . Breast cancer Neg Hx   . Mental illness Neg Hx     Social History: I have reviewed social history from my progress note on 08/27/2018 Social History   Socioeconomic History  . Marital status: Married  Spouse name: Not on file  . Number of children: 0  . Years of education: Not on file  . Highest education level: High school graduate  Occupational History  . Occupation: hairdresser  Tobacco Use  . Smoking status: Former Games developer  . Smokeless tobacco: Never Used  Vaping Use  . Vaping Use: Never used  Substance and Sexual Activity  . Alcohol use: Not Currently  . Drug use: Never  . Sexual activity: Yes    Partners: Male  Other Topics Concern  . Not on file  Social History Narrative  . Not on file   Social  Determinants of Health   Financial Resource Strain:   . Difficulty of Paying Living Expenses: Not on file  Food Insecurity:   . Worried About Programme researcher, broadcasting/film/video in the Last Year: Not on file  . Ran Out of Food in the Last Year: Not on file  Transportation Needs:   . Lack of Transportation (Medical): Not on file  . Lack of Transportation (Non-Medical): Not on file  Physical Activity:   . Days of Exercise per Week: Not on file  . Minutes of Exercise per Session: Not on file  Stress:   . Feeling of Stress : Not on file  Social Connections:   . Frequency of Communication with Friends and Family: Not on file  . Frequency of Social Gatherings with Friends and Family: Not on file  . Attends Religious Services: Not on file  . Active Member of Clubs or Organizations: Not on file  . Attends Banker Meetings: Not on file  . Marital Status: Not on file    Allergies:  Allergies  Allergen Reactions  . Codeine Nausea Only    Metabolic Disorder Labs: No results found for: HGBA1C, MPG No results found for: PROLACTIN No results found for: CHOL, TRIG, HDL, CHOLHDL, VLDL, LDLCALC No results found for: TSH  Therapeutic Level Labs: No results found for: LITHIUM No results found for: VALPROATE No components found for:  CBMZ  Current Medications: Current Outpatient Medications  Medication Sig Dispense Refill  . citalopram (CELEXA) 10 MG tablet Take 1 tablet (10 mg total) by mouth daily with breakfast. 30 tablet 2  . hydrOXYzine (ATARAX/VISTARIL) 25 MG tablet Take 0.5-1 tablets (12.5-25 mg total) by mouth daily as needed for anxiety. For severe anxiety symptoms 30 tablet 1  . valACYclovir (VALTREX) 500 MG tablet      No current facility-administered medications for this visit.     Musculoskeletal: Strength & Muscle Tone: UTA Gait & Station: normal Patient leans: N/A  Psychiatric Specialty Exam: Review of Systems  Psychiatric/Behavioral: Negative for agitation,  behavioral problems, confusion, decreased concentration, dysphoric mood, hallucinations, self-injury, sleep disturbance and suicidal ideas. The patient is nervous/anxious. The patient is not hyperactive.   All other systems reviewed and are negative.   There were no vitals taken for this visit.There is no height or weight on file to calculate BMI.  General Appearance: Casual  Eye Contact:  Fair  Speech:  Clear and Coherent  Volume:  Normal  Mood:  Anxious  Affect:  Congruent  Thought Process:  Goal Directed and Descriptions of Associations: Intact  Orientation:  Full (Time, Place, and Person)  Thought Content: Logical   Suicidal Thoughts:  No  Homicidal Thoughts:  No  Memory:  Immediate;   Fair Recent;   Fair Remote;   Fair  Judgement:  Fair  Insight:  Fair  Psychomotor Activity:  Normal  Concentration:  Concentration: Fair  and Attention Span: Fair  Recall:  [employer] of Knowledge: Fair  Language: Fair  Akathisia:  No  Handed:  Right  AIMS (if indicated): UTA  Assets:  Communication Skills Desire for Improvement Housing Social Support  ADL's:  Intact  Cognition: WNL  Sleep:  Fair   Screenings: GAD-7     [facility]/27/2018 in [facility]  Total GAD-7 Score 14       Assessment and Plan: Elsy Chiang is a 59 year old Caucasian female, married, employed, lives in [place], has a history of anxiety, attention problems was evaluated by telemedicine today.  Patient is currently stable on current medications for anxiety.  Continues to have on and off memory issues.  Plan as noted below.  Plan GAD-stable Celexa 10 mg p.o. daily.  Cognitive disorder-unstable Celexa reduced due to concerns of possible effect on memory. TSH, vitamin B12-I have reviewed labs done 06/03/2019-TSH-1.552, vitamin B12-664-within normal limits. Her memory changes are currently not significant. She will monitor her symptoms closely, will consider referral to  neurology as needed.  Patient will reestablish care with a therapist once she relocates to the new place.  Follow-up in clinic in 3 months or sooner if needed.  I have spent atleast 20 minutes  face to face with patient today. More than 50 % of the time was spent for preparing to see the patient ( e.g., review of test, records ), ordering medications and test ,psychoeducation and supportive psychotherapy and care coordination,as well as documenting clinical information in electronic health record,interpreting results of test and communication of results This note was generated in part or whole with voice recognition software. Voice recognition is usually quite accurate but there are transcription errors that can and very often do occur. I apologize for any typographical errors that were not detected and corrected.        Jomarie Longs, MD 10/01/2019, 10:30 AM

## 2019-12-09 ENCOUNTER — Telehealth: Payer: Self-pay

## 2019-12-09 DIAGNOSIS — F411 Generalized anxiety disorder: Secondary | ICD-10-CM

## 2019-12-09 MED ORDER — CITALOPRAM HYDROBROMIDE 10 MG PO TABS: 5.0000 mg | ORAL_TABLET | Freq: Every day | ORAL | 2 refills | Status: DC

## 2019-12-09 NOTE — Telephone Encounter (Signed)
Returned call to patient.  She would like to come off of the medications and she is feeling fine.  Will reduce Celexa to 5 mg for the next 14 days.  She will stop it after that.  She can monitor her symptoms and if she notices any worsening anxiety or withdrawal symptoms she will reach out to Clinical research associate.

## 2019-12-09 NOTE — Telephone Encounter (Signed)
pt called left message that she wants to stop her medication and wanted to know how she can tamper off

## 2019-12-30 ENCOUNTER — Telehealth: Payer: 59 | Admitting: Psychiatry

## 2021-03-02 ENCOUNTER — Telehealth: Payer: Self-pay

## 2021-03-02 NOTE — Telephone Encounter (Signed)
Yes is she still in [place] ? Yes I am willing to take her back if she is still here. Thank you. Pls schedule.

## 2021-03-02 NOTE — Telephone Encounter (Signed)
pt states that she like to come back to see you. she states that she seen be doing better and so she quite.  but now she wants to come back

## 2021-04-05 ENCOUNTER — Telehealth (INDEPENDENT_AMBULATORY_CARE_PROVIDER_SITE_OTHER): Payer: 59 | Admitting: Psychiatry

## 2021-04-05 ENCOUNTER — Encounter: Payer: Self-pay | Admitting: Psychiatry

## 2021-04-05 DIAGNOSIS — F4321 Adjustment disorder with depressed mood: Secondary | ICD-10-CM | POA: Diagnosis not present

## 2021-04-05 DIAGNOSIS — F411 Generalized anxiety disorder: Secondary | ICD-10-CM

## 2021-04-05 MED ORDER — CITALOPRAM HYDROBROMIDE 10 MG PO TABS: 10.0000 mg | ORAL_TABLET | Freq: Every day | ORAL | 1 refills | Status: DC

## 2021-04-05 MED ORDER — HYDROXYZINE HCL 25 MG PO TABS: 12.5000 mg | ORAL_TABLET | Freq: Every day | ORAL | 1 refills | Status: AC | PRN

## 2021-04-05 NOTE — Progress Notes (Signed)
Virtual Visit via Video Note ? ?I connected with [name] on 04/05/21 at  1:20 PM EDT by a video enabled telemedicine application and verified that I am speaking with the correct person using two identifiers. ? ?Location ?Provider Location : [facility] ?Patient Location : Home ? ?Participants: Patient , Provider ?  ?I discussed the limitations of evaluation and management by telemedicine and the availability of in person appointments. The patient expressed understanding and agreed to proceed. ? ?  ?I discussed the assessment and treatment plan with the patient. The patient was provided an opportunity to ask questions and all were answered. The patient agreed with the plan and demonstrated an understanding of the instructions. ?  ?The patient was advised to call back or seek an in-person evaluation if the symptoms worsen or if the condition fails to improve as anticipated. ? ? ?BH MD OP Progress Note ? ?04/05/2021 5:50 PM ?[name]  ?MRN:  [id number] ? ?Chief Complaint:  ?Chief Complaint  ?Patient presents with  ? Follow-up: 61 year old Caucasian female with history of GAD, last seen on 10/01/2019, presented for follow-up appointment for medication management.  ? ?HPI: [name] is a 61 year old Caucasian female, married, currently retired, lives in [place], has a history of GAD, was evaluated by telemedicine today. ? ?Patient's last visit was on 10/01/2019. ? ?Patient retired and moved to [place] in September 2022 with her spouse.  Patient reports she was doing fairly well for a while with regards to her mood however since her relocation to [facility] she has noticed her mood symptoms as getting worse and hence she decided to reach out for a follow-up appointment. ? ?Patient reports she is having trouble with multiple psychosocial stressors, financial concerns, adjusting to her new home which does have an impact on her mood.  Patient reports she currently does not believe this is what she was planning  for herself with regards to retirement .  That does make her anxious and worried.  She reports she finds different things to worry about including the world, nuclear disaster , neighbors not taking care of their recycling and so on.  She is currently not taking her Celexa.  She reports when she took it in the past it was beneficial. ? ?Patient also reports low mood on and off when she worries too much about her situation.  Patient also reports sleep problems.  She reports sleep is interrupted often. ? ?Patient reports she did join a woman's group and yoga however had to stop some of it due to financial concerns. ? ?She is interested in reestablishing care with a therapist. ? ?Denies suicidality, homicidality or perceptual disturbances. ? ?Does report using alcohol excessively for a few months.  She however reports she stopped doing that a few weeks ago.  Last drink was a couple of weeks ago when she had a beer at a party. ? ? ? ? ? ?Visit Diagnosis:  ?  ICD-10-CM   ?1. GAD (generalized anxiety disorder)  F41.1 citalopram (CELEXA) 10 MG tablet  ?  hydrOXYzine (ATARAX) 25 MG tablet  ?  ?2. Adjustment disorder with depressed mood  F43.21   ?  ? ? ?Past Psychiatric History: Reviewed past psychiatric history from progress note on 08/27/2018. ? ?Past Medical History:  ?Past Medical History:  ?Diagnosis Date  ? Anxiety   ?  ?Past Surgical History:  ?Procedure Laterality Date  ? TONSILLECTOMY    ? ? ?Family Psychiatric History: Reviewed family psychiatric history from progress note on 08/27/2018. ? ?  Family History:  ?Family History  ?Problem Relation Age of Onset  ? Breast cancer Neg Hx   ? Mental illness Neg Hx   ? ? ?Social History: Reviewed social history from progress note on 08/27/2018. ?Social History  ? ?Socioeconomic History  ? Marital status: Married  ?  Spouse name: Not on file  ? Number of children: 0  ? Years of education: Not on file  ? Highest education level: High school graduate  ?Occupational History  ?  Occupation: hairdresser  ?Tobacco Use  ? Smoking status: Former  ? Smokeless tobacco: Never  ?Vaping Use  ? Vaping Use: Never used  ?Substance and Sexual Activity  ? Alcohol use: Not Currently  ? Drug use: Never  ? Sexual activity: Yes  ?  Partners: Male  ?Other Topics Concern  ? Not on file  ?Social History Narrative  ? Not on file  ? ?Social Determinants of Health  ? ?Financial Resource Strain: Not on file  ?Food Insecurity: Not on file  ?Transportation Needs: Not on file  ?Physical Activity: Not on file  ?Stress: Not on file  ?Social Connections: Not on file  ? ? ?Allergies:  ?Allergies  ?Allergen Reactions  ? Codeine Nausea Only  ? ? ?Metabolic Disorder Labs: ?No results found for: HGBA1C, MPG ?No results found for: PROLACTIN ?No results found for: CHOL, TRIG, HDL, CHOLHDL, VLDL, LDLCALC ?No results found for: TSH ? ?Therapeutic Level Labs: ?No results found for: LITHIUM ?No results found for: VALPROATE ?No components found for:  CBMZ ? ?Current Medications: ?Current Outpatient Medications  ?Medication Sig Dispense Refill  ? ferrous sulfate 325 (65 FE) MG EC tablet Take 325 mg by mouth 3 (three) times daily with meals.    ? ibuprofen (ADVIL) 800 MG tablet Take 800 mg by mouth every 6 (six) hours as needed.    ? valACYclovir (VALTREX) 500 MG tablet     ? citalopram (CELEXA) 10 MG tablet Take 1 tablet (10 mg total) by mouth daily with breakfast. 30 tablet 1  ? hydrOXYzine (ATARAX) 25 MG tablet Take 0.5-1 tablets (12.5-25 mg total) by mouth daily as needed for anxiety. For severe anxiety symptoms 30 tablet 1  ? pantoprazole (PROTONIX) 40 MG tablet Take 1 tablet by mouth daily. (Patient not taking: Reported on 04/05/2021)    ? ?No current facility-administered medications for this visit.  ? ? ? ?Musculoskeletal: ?Strength & Muscle Tone:  UTA ?Gait & Station:  Seated ?Patient leans: N/A ? ?Psychiatric Specialty Exam: ?Review of Systems  ?Psychiatric/Behavioral:  Positive for sleep disturbance. The patient is  nervous/anxious.   ?All other systems reviewed and are negative.  ?There were no vitals taken for this visit.There is no height or weight on file to calculate BMI.  ?General Appearance: Casual  ?Eye Contact:  Fair  ?Speech:  Clear and Coherent  ?Volume:  Normal  ?Mood:  Anxious and Depressed  ?Affect:  Congruent  ?Thought Process:  Goal Directed and Descriptions of Associations: Intact  ?Orientation:  Full (Time, Place, and Person)  ?Thought Content: Rumination   ?Suicidal Thoughts:  No  ?Homicidal Thoughts:  No  ?Memory:  Immediate;   Fair ?Recent;   Fair ?Remote;   Fair  ?Judgement:  Fair  ?Insight:  Good  ?Psychomotor Activity:  Normal  ?Concentration:  Concentration: Fair and Attention Span: Fair  ?Recall:  Fair  ?Fund of Knowledge: Fair  ?Language: Fair  ?Akathisia:  No  ?Handed:  Right  ?AIMS (if indicated): not done  ?Assets:  Communication  Skills ?Desire for Improvement ?Housing ?Intimacy ?Social Support  ?ADL's:  Intact  ?Cognition: WNL  ?Sleep:  Poor  ? ?Screenings: ?GAD-7   ? ?Flowsheet Row Video Visit from [facility]05/2021 [facility]  ?Total GAD-7 Score 17 14  ? ?  ? ?PHQ2-9   ? ?Flowsheet Row Video [facility]05[facility]2021 [facility]  ?PHQ-2 Total Score 4  ?PHQ-9 Total Score 9  ? ?  ? ?Flowsheet Row Video [facility]05[facility]20[facility]  ?C-SSRS RISK CATEGORY No Risk  ? ?  ? ? ? ?Assessment and Plan: Ruth [name] is a 61 year old Caucasian female, married, retired, currently lives in [place], has a history of GAD, presented for medication management follow-up.  Patient with multiple psychosocial stressors, recent relocation, currently struggling with anxiety symptoms and sleep problems, will benefit from medication management. ? ?Plan ?GAD-unstable ?Start Celexa 10 mg p.o. daily ?Start hydroxyzine 12.5-25 mg p.o. daily as needed for anxiety  as well as sleep. ? ?Adjustment disorder with depressed mood-unstable ?Patient will benefit from psychotherapy sessions. ?Start Celexa and hydroxyzine as noted above. ? ?Provided information for therapist in the communit

## 2021-05-17 ENCOUNTER — Encounter: Payer: Self-pay | Admitting: Psychiatry

## 2021-05-17 ENCOUNTER — Telehealth (INDEPENDENT_AMBULATORY_CARE_PROVIDER_SITE_OTHER): Payer: 59 | Admitting: Psychiatry

## 2021-05-17 DIAGNOSIS — F4321 Adjustment disorder with depressed mood: Secondary | ICD-10-CM | POA: Diagnosis not present

## 2021-05-17 DIAGNOSIS — F411 Generalized anxiety disorder: Secondary | ICD-10-CM

## 2021-05-17 MED ORDER — CITALOPRAM HYDROBROMIDE 10 MG PO TABS: 10.0000 mg | ORAL_TABLET | Freq: Every day | ORAL | 0 refills | Status: DC

## 2021-05-17 NOTE — Progress Notes (Signed)
Virtual Visit via Video Note ? ?I connected with [name] on 05/17/21 at  1:20 PM EDT by a video enabled telemedicine application and verified that I am speaking with the correct person using two identifiers. ? ?Location ?Provider Location : [facility] ?Patient Location : Home ? ?Participants: Patient , Provider ? ?  ?I discussed the limitations of evaluation and management by telemedicine and the availability of in person appointments. The patient expressed understanding and agreed to proceed. ?  ?I discussed the assessment and treatment plan with the patient. The patient was provided an opportunity to ask questions and all were answered. The patient agreed with the plan and demonstrated an understanding of the instructions. ?  ?The patient was advised to call back or seek an in-person evaluation if the symptoms worsen or if the condition fails to improve as anticipated. ? ?BH MD OP Progress Note ? ?05/17/2021 1:31 PM ?[name]  ?MRN:  [id number] ? ?Chief Complaint:  ?Chief Complaint  ?Patient presents with  ? Follow-up: 61 year old Caucasian female with a history of GAD was evaluated for medication management.  ? ?HPI: [name] is a 61 year old Caucasian female, married, currently retired, lives in [place], has a history of GAD was evaluated by telemedicine today. ? ?Patient today reports she is currently improving on the current dosage of Celexa.  She denies side effects. ? ?Patient reports she is finally settling into her new home.  Patient reports she has been going out for walks in the morning.  She also does yoga at home.  Patient reports she is planning on taking a trip to [place] for 2 weeks and that also lifts her spirits.  She has been trying to do some shopping and planning for that. ? ?Patient continues to worry about her stepson who is dependent on her husband who is his dad.  Patient reports that does worry her.  However she has come to terms with that and does not let it overwhelm her too  much. ? ?Patient denies any suicidality, homicidality or perceptual disturbances. ? ?Patient reports sleep as good. ? ?Patient reports appetite is fair. ? ?Reports she has not been drinking alcohol excessively since her last visit with Clinical research associate.  She is very mindful of that. ? ?Denies any other concerns today. ? ?Visit Diagnosis:  ?  ICD-10-CM   ?1. GAD (generalized anxiety disorder)  F41.1 citalopram (CELEXA) 10 MG tablet  ?  ?2. Adjustment disorder with depressed mood  F43.21   ?  ? ? ?Past Psychiatric History: Reviewed past psychiatric history from progress note on 08/27/2018. ? ?Past Medical History:  ?Past Medical History:  ?Diagnosis Date  ? Anxiety   ?  ?Past Surgical History:  ?Procedure Laterality Date  ? TONSILLECTOMY    ? ? ?Family Psychiatric History: Reviewed family psychiatric history from progress note on 08/27/2018. ? ?Family History:  ?Family History  ?Problem Relation Age of Onset  ? Breast cancer Neg Hx   ? Mental illness Neg Hx   ? ? ?Social History: Reviewed social history from progress note on 08/27/2018. ?Social History  ? ?Socioeconomic History  ? Marital status: Married  ?  Spouse name: Not on file  ? Number of children: 0  ? Years of education: Not on file  ? Highest education level: High school graduate  ?Occupational History  ? Occupation: hairdresser  ?Tobacco Use  ? Smoking status: Former  ? Smokeless tobacco: Never  ?Vaping Use  ? Vaping Use: Never used  ?Substance and Sexual Activity  ?  Alcohol use: Not Currently  ? Drug use: Never  ? Sexual activity: Yes  ?  Partners: Male  ?Other Topics Concern  ? Not on file  ?Social History Narrative  ? Not on file  ? ?Social Determinants of Health  ? ?Financial Resource Strain: Not on file  ?Food Insecurity: Not on file  ?Transportation Needs: Not on file  ?Physical Activity: Not on file  ?Stress: Not on file  ?Social Connections: Not on file  ? ? ?Allergies:  ?Allergies  ?Allergen Reactions  ? Codeine Nausea Only  ? ? ?Metabolic Disorder Labs: ?No  results found for: HGBA1C, MPG ?No results found for: PROLACTIN ?No results found for: CHOL, TRIG, HDL, CHOLHDL, VLDL, LDLCALC ?No results found for: TSH ? ?Therapeutic Level Labs: ?No results found for: LITHIUM ?No results found for: VALPROATE ?No components found for:  CBMZ ? ?Current Medications: ?Current Outpatient Medications  ?Medication Sig Dispense Refill  ? citalopram (CELEXA) 10 MG tablet Take 1 tablet (10 mg total) by mouth daily with breakfast. 90 tablet 0  ? ferrous sulfate 325 (65 FE) MG EC tablet Take 325 mg by mouth 3 (three) times daily with meals.    ? hydrOXYzine (ATARAX) 25 MG tablet Take 0.5-1 tablets (12.5-25 mg total) by mouth daily as needed for anxiety. For severe anxiety symptoms 30 tablet 1  ? ibuprofen (ADVIL) 800 MG tablet Take 800 mg by mouth every 6 (six) hours as needed.    ? pantoprazole (PROTONIX) 40 MG tablet Take 1 tablet by mouth daily. (Patient not taking: Reported on 04/05/2021)    ? valACYclovir (VALTREX) 500 MG tablet     ? ?No current facility-administered medications for this visit.  ? ? ? ?Musculoskeletal: ?Strength & Muscle Tone:  UTA ?Gait & Station:  Seated ?Patient leans: N/A ? ?Psychiatric Specialty Exam: ?Review of Systems  ?Psychiatric/Behavioral:  The patient is nervous/anxious.   ?All other systems reviewed and are negative.  ?There were no vitals taken for this visit.There is no height or weight on file to calculate BMI.  ?General Appearance: Casual  ?Eye Contact:  Fair  ?Speech:  Normal Rate  ?Volume:  Normal  ?Mood:  Anxious  ?Affect:  Congruent  ?Thought Process:  Goal Directed and Descriptions of Associations: Intact  ?Orientation:  Full (Time, Place, and Person)  ?Thought Content: Logical   ?Suicidal Thoughts:  No  ?Homicidal Thoughts:  No  ?Memory:  Immediate;   Fair ?Recent;   Fair ?Remote;   Fair  ?Judgement:  Fair  ?Insight:  Fair  ?Psychomotor Activity:  Normal  ?Concentration:  Concentration: Fair and Attention Span: Fair  ?Recall:  Fair  ?Fund of  Knowledge: Fair  ?Language: Fair  ?Akathisia:  No  ?Handed:  Right  ?AIMS (if indicated): done,0  ?Assets:  Communication Skills ?Desire for Improvement ?Housing ?Social Support  ?ADL's:  Intact  ?Cognition: WNL  ?Sleep:  Fair  ? ?Screenings: ?GAD-7   ? ?Flowsheet Row Video Visit from 05/17/2021 [facility] Video Visit from 04/05/2021 in [facility] Office Visit from 08/27/2018 in [facility]  ?Total GAD-7 Score 3 17 14   ? ?  ? ?PHQ2-9   ? ?Flowsheet Row Video Visit from 05/17/2021 in [facility] Video Visit from 04/05/2021 in [facility]  ?PHQ-2 Total Score 0 4  ?PHQ-9 Total Score 0 9  ? ?  ? ?Flowsheet Row Video Visit from 04/05/2021 in [facility]  ?C-SSRS RISK CATEGORY No  Risk  ? ?  ? ? ? ?Assessment and Plan: Ben Sanz is a 61 year old Caucasian female, married, retired, currently lives in [place], has a history of GAD, was evaluated by telemedicine today.  Patient is currently improving. ? ?Plan ?GAD-improving ?Celexa 10 mg p.o. daily ?Hydroxyzine 12.5-25 mg p.o. daily as needed for anxiety as well as sleep. ? ?Adjustment disorder with depressed mood-improving ?Patient was referred for CBT. ?Celexa and hydroxyzine as noted above. ? ?Follow-up in clinic in 2 to 3 months or sooner if needed. ? ? ?Consent: Patient/Guardian gives verbal consent for treatment and assignment of benefits for services provided during this visit. Patient/Guardian expressed understanding and agreed to proceed.  ? ?This note was generated in part or whole with voice recognition software. Voice recognition is usually quite accurate but there are transcription errors that can and very often do occur. I apologize for any typographical errors that were not detected and corrected. ? ? ? ? ? ?Jomarie Longs, MD ?05/18/2021, 8:20 AM ? ?

## 2021-07-26 ENCOUNTER — Encounter: Payer: Self-pay | Admitting: Psychiatry

## 2021-07-26 ENCOUNTER — Telehealth (INDEPENDENT_AMBULATORY_CARE_PROVIDER_SITE_OTHER): Payer: 59 | Admitting: Psychiatry

## 2021-07-26 DIAGNOSIS — F4321 Adjustment disorder with depressed mood: Secondary | ICD-10-CM | POA: Diagnosis not present

## 2021-07-26 DIAGNOSIS — F411 Generalized anxiety disorder: Secondary | ICD-10-CM | POA: Diagnosis not present

## 2021-07-26 MED ORDER — CITALOPRAM HYDROBROMIDE 10 MG PO TABS: 5.0000 mg | ORAL_TABLET | ORAL | 0 refills | Status: AC

## 2021-07-26 NOTE — Progress Notes (Signed)
Virtual Visit via Video Note  I connected with [name] on 07/26/21 at  1:40 PM EDT by a video enabled telemedicine application and verified that I am speaking with the correct person using two identifiers.  Location Provider Location : [facility] Patient Location : Home  Participants: Patient , Provider    I discussed the limitations of evaluation and management by telemedicine and the availability of in person appointments. The patient expressed understanding and agreed to proceed.   I discussed the assessment and treatment plan with the patient. The patient was provided an opportunity to ask questions and all were answered. The patient agreed with the plan and demonstrated an understanding of the instructions.   The patient was advised to call back or seek an in-person evaluation if the symptoms worsen or if the condition fails to improve as anticipated.   BH MD OP Progress Note  07/26/2021 2:01 PM [name]  MRN:  [id number]  Chief Complaint:  Chief Complaint  Patient presents with   Follow-up: 61 year old Caucasian female with history of anxiety was evaluated for medication management.   HPI: [name] is a 61 year old Caucasian female, married, currently retired, lives in [place], has a history of GAD was evaluated by telemedicine today.  Patient reports she is currently back from her vacation.  She is currently trying to go on another trip and is excited about that.  Patient reports she had a good time.  She also reports situational stressors at home as better.  Her stepson is currently employed and that is a big relief.  Patient reports she would like to make use of alternative treatment options like yoga for her anxiety rather than depending on medications.  She also believes she feels much better now and she may not even need the Celexa anymore.  Would like to be tapered off of it.  Denies any depressive symptoms.  Reports appetite is good.  Reports sleep is  good.  Denies suicidality, homicidality or perceptual disturbances.  Denies any other concerns today.  Visit Diagnosis:    ICD-10-CM   1. GAD (generalized anxiety disorder)  F41.1 citalopram (CELEXA) 10 MG tablet    2. Adjustment disorder with depressed mood  F43.21       Past Psychiatric History: Reviewed past psychiatric history from progress note on 08/27/2018.  Past Medical History:  Past Medical History:  Diagnosis Date   Anxiety     Past Surgical History:  Procedure Laterality Date   TONSILLECTOMY      Family Psychiatric History: Reviewed family psychiatric history from progress note on 08/27/2018.  Family History:  Family History  Problem Relation Age of Onset   Breast cancer Neg Hx    Mental illness Neg Hx     Social History: Reviewed social history from progress note on 08/27/2018. Social History   Socioeconomic History   Marital status: Married    Spouse name: Not on file   Number of children: 0   Years of education: Not on file   Highest education level: High school graduate  Occupational History   Occupation: hairdresser  Tobacco Use   Smoking status: Former   Smokeless tobacco: Never  Building services engineer Use: Never used  Substance and Sexual Activity   Alcohol use: Not Currently   Drug use: Never   Sexual activity: Yes    Partners: Male  Other Topics Concern   Not on file  Social History Narrative   Not on file   Social Determinants of  Health   Financial Resource Strain: Low Risk  (08/27/2018)   Overall Financial Resource Strain (CARDIA)    Difficulty of Paying Living Expenses: Not hard at all  Food Insecurity: No Food Insecurity (08/27/2018)   Hunger Vital Sign    Worried About Running Out of Food in the Last Year: Never true    Ran Out of Food in the Last Year: Never true  Transportation Needs: No Transportation Needs (08/27/2018)   PRAPARE - Administrator, Civil Service (Medical): No    Lack of Transportation (Non-Medical): No   Physical Activity: Sufficiently Active (08/27/2018)   Exercise Vital Sign    Days of Exercise per Week: 7 days    Minutes of Exercise per Session: 60 min  Stress: Stress Concern Present (08/27/2018)   [employer] of Occupational Health - Occupational Stress Questionnaire    Feeling of Stress : Very much  Social Connections: Moderately Isolated (08/27/2018)   Social Connection and Isolation Panel [NHANES]    Frequency of Communication with Friends and Family: Never    Frequency of Social Gatherings with Friends and Family: Never    Attends Religious Services: 1 to 4 times per year    Active Member of [employer] or Organizations: No    Attends Banker Meetings: Never    Marital Status: Married    Allergies:  Allergies  Allergen Reactions   Codeine Nausea Only    Metabolic Disorder Labs: No results found for: "HGBA1C", "MPG" No results found for: "PROLACTIN" No results found for: "CHOL", "TRIG", "HDL", "CHOLHDL", "VLDL", "LDLCALC" No results found for: "TSH"  Therapeutic Level Labs: No results found for: "LITHIUM" No results found for: "VALPROATE" No results found for: "CBMZ"  Current Medications: Current Outpatient Medications  Medication Sig Dispense Refill   hydrOXYzine (ATARAX) 25 MG tablet Take 0.5-1 tablets (12.5-25 mg total) by mouth daily as needed for anxiety. For severe anxiety symptoms 30 tablet 1   ibuprofen (ADVIL) 800 MG tablet Take 800 mg by mouth every 6 (six) hours as needed.     pantoprazole (PROTONIX) 40 MG tablet Take 1 tablet by mouth daily.     valACYclovir (VALTREX) 500 MG tablet      citalopram (CELEXA) 10 MG tablet Take 0.5 tablets (5 mg total) by mouth as directed. Take 0.5 tablet daily for 2 weeks and 0.5 tablet every other day for a week after that and stop 90 tablet 0   ferrous sulfate 325 (65 FE) MG EC tablet Take 325 mg by mouth 3 (three) times daily with meals. (Patient not taking: Reported on 07/26/2021)     No current  facility-administered medications for this visit.     Musculoskeletal: Strength & Muscle Tone:  UTA Gait & Station:  Seated Patient leans: N/A  Psychiatric Specialty Exam: Review of Systems  Psychiatric/Behavioral: Negative.    All other systems reviewed and are negative.   There were no vitals taken for this visit.There is no height or weight on file to calculate BMI.  General Appearance: Casual  Eye Contact:  Fair  Speech:  Clear and Coherent  Volume:  Normal  Mood:  Euthymic  Affect:  Congruent  Thought Process:  Goal Directed and Descriptions of Associations: Intact  Orientation:  Full (Time, Place, and Person)  Thought Content: Logical   Suicidal Thoughts:  No  Homicidal Thoughts:  No  Memory:  Immediate;   Fair Recent;   Fair Remote;   Fair  Judgement:  Fair  Insight:  Fair  Psychomotor Activity:  Normal  Concentration:  Concentration: Fair and Attention Span: Fair  Recall:  [employer] of Knowledge: Fair  Language: Fair  Akathisia:  No  Handed:  Right  AIMS (if indicated): done  Assets:  Communication Skills Desire for Improvement Housing Social Support  ADL's:  Intact  Cognition: WNL  Sleep:  Fair   Screenings: AIMS    Flowsheet Row Video [facility] 07/26/2021 in [facility]  AIMS Total Score 0      GAD-7    Flowsheet Row Video Visit from 07/26/2021 in [facility] Video Visit from 05/17/2021 in [facility] Video Visit from 04/05/2021 in [facility] Office Visit from 08/27/2018 in [facility]  Total GAD-7 Score 2 3 17 14       PHQ2-9    Flowsheet Row Video Visit from 07/26/2021 in [facility] Video Visit from 05/17/2021 in [facility] Video Visit from 04/05/2021 in [facility]  PHQ-2 Total Score 0 0 4  PHQ-9 Total Score -- 0 9       Flowsheet Row Video Visit from 07/26/2021 in [facility] Video Visit from 04/05/2021 in [facility]  C-SSRS RISK CATEGORY No Risk No Risk        Assessment and Plan: [name] is a 61 year old Caucasian female, married, retired, currently lives in [place], has a history of GAD was evaluated by telemedicine today.  Patient is currently stable.  Plan GAD-stable Taper of Celexa.  Advised to start Celexa 5 mg p.o. daily for 2 weeks, every other day after that for a week and stop taking it. Hydroxyzine 12.5-25 mg p.o. daily as needed for severe anxiety as well as sleep.   Adjustment disorder with depressed mood-resolved Patient was referred for CBT in the past.  Patient advised to contact the clinic back for medication management as needed if she has any worsening mood symptoms.  Follow-up in clinic in 3 months or sooner if needed.  Consent: Patient/Guardian gives verbal consent for treatment and assignment of benefits for services provided during this visit. Patient/Guardian expressed understanding and agreed to proceed.   This note was generated in part or whole with voice recognition software. Voice recognition is usually quite accurate but there are transcription errors that can and very often do occur. I apologize for any typographical errors that were not detected and corrected.      02-21[birth year]1984, MD 07/27/2021, 8:19 AM

## 2021-11-06 ENCOUNTER — Telehealth: Payer: 59 | Admitting: Psychiatry
